# Patient Record
Sex: Female | Born: 1995 | Race: Black or African American | Hispanic: No | Marital: Married | Smoking: Never smoker
Health system: Southern US, Community
[De-identification: ages and names within clinical notes are randomized; demographics above are authoritative.]

## PROBLEM LIST (undated history)

## (undated) DIAGNOSIS — Z789 Other specified health status: Secondary | ICD-10-CM

## (undated) HISTORY — DX: Other specified health status: Z78.9

## (undated) HISTORY — PX: NO PAST SURGERIES: SHX2092

---

## 2020-06-10 ENCOUNTER — Encounter (HOSPITAL_COMMUNITY): Payer: Self-pay | Admitting: Emergency Medicine

## 2020-06-10 ENCOUNTER — Emergency Department (HOSPITAL_COMMUNITY)
Admission: EM | Admit: 2020-06-10 | Discharge: 2020-06-10 | Disposition: A | Discharge status: Home / Self Care | Attending: Emergency Medicine | Admitting: Emergency Medicine

## 2020-06-10 ENCOUNTER — Other Ambulatory Visit: Payer: Self-pay

## 2020-06-10 DIAGNOSIS — N898 Other specified noninflammatory disorders of vagina: Secondary | ICD-10-CM | POA: Diagnosis not present

## 2020-06-10 DIAGNOSIS — B3731 Acute candidiasis of vulva and vagina: Secondary | ICD-10-CM

## 2020-06-10 DIAGNOSIS — A64 Unspecified sexually transmitted disease: Secondary | ICD-10-CM | POA: Insufficient documentation

## 2020-06-10 DIAGNOSIS — B373 Candidiasis of vulva and vagina: Secondary | ICD-10-CM

## 2020-06-10 LAB — URINALYSIS, ROUTINE W REFLEX MICROSCOPIC
Bacteria, UA: NONE SEEN
Bilirubin Urine: NEGATIVE
Glucose, UA: NEGATIVE mg/dL
Hgb urine dipstick: NEGATIVE
Ketones, ur: NEGATIVE mg/dL
Nitrite: NEGATIVE
Protein, ur: NEGATIVE mg/dL
Specific Gravity, Urine: 1.014 (ref 1.005–1.030)
pH: 7 (ref 5.0–8.0)

## 2020-06-10 LAB — PREGNANCY, URINE: Preg Test, Ur: NEGATIVE

## 2020-06-10 LAB — WET PREP, GENITAL
Clue Cells Wet Prep HPF POC: NONE SEEN
Sperm: NONE SEEN
Trich, Wet Prep: NONE SEEN
Yeast Wet Prep HPF POC: NONE SEEN

## 2020-06-10 MED ORDER — FLUCONAZOLE 150 MG PO TABS
150.0000 mg | ORAL_TABLET | Freq: Once | ORAL | Status: AC
  Administered 2020-06-10: 150 mg via ORAL
  Filled 2020-06-10: qty 1

## 2020-06-10 MED ORDER — KETOCONAZOLE 2 % EX CREA: TOPICAL_CREAM | CUTANEOUS | 0 refills | Status: AC

## 2020-06-10 NOTE — ED Notes (Signed)
[name] speaking

## 2020-06-10 NOTE — ED Triage Notes (Signed)
Pt arrives to ED with concerns for STD. Husband has same symptoms. Pt is experiencing bumps and white discharge.

## 2020-06-10 NOTE — Discharge Instructions (Addendum)
Contact a health care provider if:  You have a fever.  Your symptoms go away and then return.  Your symptoms do not get better with treatment.  Your symptoms get worse.  You have new symptoms.  You develop blisters in or around your vagina.  You have blood coming from your vagina and it is not your menstrual period.  You develop pain in your abdomen.

## 2020-06-10 NOTE — ED Provider Notes (Signed)
[facility] EMERGENCY DEPARTMENT Provider Note   CSN: [id number] Arrival date & time: 06/10/20  1338     History Chief Complaint  Patient presents with  . SEXUALLY TRANSMITTED DISEASE    Linda Caldwell is a 24 y.o. female who is here with complaint of vaginal symptoms.  There is a language barrier and [place] services are utilized.  Despite using translation services still very difficult history as the patient is very vague in discussing her symptoms.  She states that she has recurrent infections.  She states that her vagina just "does not feel right."  She denies vaginal discharge but states that she has seen some white stuff.  She denies pain in her pelvis but states that it does burn when she urinates.  Last menstrual period was on 05/28/2020.  She is sexually active with a single female partner, her husband. she denies having any bumps or lesions in her groin.  She denies fevers, chills, hematuria, foul odor of urine or flank pain.  She is not on any form of birth control and is trying to get pregnant. HPI     History reviewed. No pertinent past medical history.  There are no problems to display for this patient.   .   OB History   No obstetric history on file.     History reviewed. No pertinent family history.     Home Medications Prior to Admission medications   Not on File    Allergies    Patient has no known allergies.  Review of Systems   Review of Systems  Constitutional: Negative for chills and fever.  Genitourinary: Positive for dysuria and vaginal discharge. Negative for flank pain, frequency, genital sores, hematuria, menstrual problem and pelvic pain.    Physical Exam Updated Vital Signs BP 120/79   Pulse 70   Temp 98.4 F (36.9 C) (Oral)   Resp 16   SpO2 100%   Physical Exam Vitals and nursing note reviewed. Exam conducted with a chaperone present.  Constitutional:      General: She is not in  acute distress.    Appearance: She is well-developed and well-nourished. She is not diaphoretic.  HENT:     Head: Normocephalic and atraumatic.  Eyes:     General: No scleral icterus.    Conjunctiva/sclera: Conjunctivae normal.  Cardiovascular:     Rate and Rhythm: Normal rate and regular rhythm.     Heart sounds: Normal heart sounds. No murmur heard. No friction rub. No gallop.   Pulmonary:     Effort: Pulmonary effort is normal. No respiratory distress.     Breath sounds: Normal breath sounds.  Abdominal:     General: Bowel sounds are normal. There is no distension.     Palpations: Abdomen is soft. There is no mass.     Tenderness: There is no abdominal tenderness. There is no guarding.  Genitourinary:    General: Normal vulva.     Pubic Area: No rash.      Labia:        Right: No rash.        Left: No rash.      Vagina: Vaginal discharge present.     Cervix: Normal.     Uterus: Normal.      Adnexa: Right adnexa normal and left adnexa normal.       Right: No tenderness.         Left: No tenderness.    Musculoskeletal:  Cervical back: Normal range of motion.  Lymphadenopathy:     Lower Body: No right inguinal adenopathy. No left inguinal adenopathy.  Skin:    General: Skin is warm and dry.  Neurological:     Mental Status: She is alert and oriented to person, place, and time.  Psychiatric:        Behavior: Behavior normal.     ED Results / Procedures / Treatments   Labs (all labs ordered are listed, but only abnormal results are displayed) Labs Reviewed  WET PREP, GENITAL  URINALYSIS, ROUTINE W REFLEX MICROSCOPIC  PREGNANCY, URINE  GC/CHLAMYDIA PROBE AMP ([place]) NOT AT [facility]    EKG None  Radiology No results found.  Procedures Procedures (including critical care time)  Medications Ordered in ED Medications - No data to display  ED Course  I have reviewed the triage vital signs and the nursing notes.  Pertinent labs & imaging results that  were available during my care of the patient were reviewed by me and considered in my medical decision making (see chart for details).    MDM Rules/Calculators/A&P                          Here with vaginal complaints.  She does have thick discharge consistent with what appears to be probably yeast vaginitis.  I ordered and reviewed labs which included wet prep which shows no significant abnormalities except for few white blood cells, negative pregnancy test, urine appears to be without infection, GC chlamydia probe is pending.  No evidence of PID on examination.  Will treat with single dose of oral fluconazole and then ketoconazole topically for 14 days.  Patient can follow-up with primary care physician.  She was appropriate for discharge at time Final Clinical Impression(s) / ED Diagnoses Final diagnoses:  None    Rx / DC Orders ED Discharge Orders    None       Arthor Captain, PA-C 06/10/20 1717    Tilden Fossa, MD 06/11/20 [phone]

## 2020-06-13 LAB — GC/CHLAMYDIA PROBE AMP (~~LOC~~) NOT AT ARMC
Chlamydia: NEGATIVE
Comment: NEGATIVE
Comment: NORMAL
Neisseria Gonorrhea: NEGATIVE

## 2020-06-18 NOTE — L&D Delivery Note (Signed)
Delivery Note Patient was complete a pushing when I entered. She quickly progressed to crowing and utilized asynchronous pushing and the mirror to push. Interpreter utilized the entire time.   At 6:07 PM a viable female was delivered via Vaginal, Spontaneous (Presentation: Right Occiput  Anterior).  APGAR: 9, 9; weight - pending.   Placenta status: Spontaneous, Intact.  Cord: 3 vessels with the following complications: None.  Cord pH: not collected  Anesthesia: Epidural Episiotomy: None Lacerations:  bilateral labial abrasions/hemostatic Suture Repair:  NA Est. Blood Loss (mL): 150  Mom to postpartum.  Baby to Couplet care / Skin to Skin.  Linda Caldwell [facility] 06/17/2021, 7:04 PM

## 2020-10-13 ENCOUNTER — Emergency Department (HOSPITAL_COMMUNITY)
Admission: EM | Admit: 2020-10-13 | Discharge: 2020-10-14 | Disposition: A | Discharge status: Home / Self Care | Attending: Emergency Medicine | Admitting: Emergency Medicine

## 2020-10-13 DIAGNOSIS — R630 Anorexia: Secondary | ICD-10-CM | POA: Diagnosis not present

## 2020-10-13 DIAGNOSIS — Z3A00 Weeks of gestation of pregnancy not specified: Secondary | ICD-10-CM | POA: Diagnosis not present

## 2020-10-13 DIAGNOSIS — Z3490 Encounter for supervision of normal pregnancy, unspecified, unspecified trimester: Secondary | ICD-10-CM

## 2020-10-13 DIAGNOSIS — R1033 Periumbilical pain: Secondary | ICD-10-CM | POA: Diagnosis not present

## 2020-10-13 DIAGNOSIS — R109 Unspecified abdominal pain: Secondary | ICD-10-CM

## 2020-10-13 DIAGNOSIS — R102 Pelvic and perineal pain: Secondary | ICD-10-CM | POA: Diagnosis not present

## 2020-10-13 DIAGNOSIS — O26891 Other specified pregnancy related conditions, first trimester: Secondary | ICD-10-CM | POA: Insufficient documentation

## 2020-10-13 DIAGNOSIS — R197 Diarrhea, unspecified: Secondary | ICD-10-CM | POA: Insufficient documentation

## 2020-10-13 LAB — COMPREHENSIVE METABOLIC PANEL
ALT: 15 U/L (ref 0–44)
AST: 17 U/L (ref 15–41)
Albumin: 3.7 g/dL (ref 3.5–5.0)
Alkaline Phosphatase: 61 U/L (ref 38–126)
Anion gap: 6 (ref 5–15)
BUN: 10 mg/dL (ref 6–20)
CO2: 24 mmol/L (ref 22–32)
Calcium: 9.3 mg/dL (ref 8.9–10.3)
Chloride: 105 mmol/L (ref 98–111)
Creatinine, Ser: 0.65 mg/dL (ref 0.44–1.00)
GFR, Estimated: >60 mL/min (ref >60)
Glucose, Bld: 84 mg/dL (ref 70–99)
Potassium: 3.4 mmol/L — ABNORMAL LOW (ref 3.5–5.1)
Sodium: 135 mmol/L (ref 135–145)
Total Bilirubin: 0.5 mg/dL (ref 0.3–1.2)
Total Protein: 7.4 g/dL (ref 6.5–8.1)

## 2020-10-13 LAB — CBC WITH DIFFERENTIAL/PLATELET
Abs Immature Granulocytes: 0.02 10*3/uL (ref 0.00–0.07)
Basophils Absolute: 0 10*3/uL (ref 0.0–0.1)
Basophils Relative: 1 %
Eosinophils Absolute: 0.1 10*3/uL (ref 0.0–0.5)
Eosinophils Relative: 2 %
HCT: 34.9 % — ABNORMAL LOW (ref 36.0–46.0)
Hemoglobin: 11.5 g/dL — ABNORMAL LOW (ref 12.0–15.0)
Immature Granulocytes: 0 %
Lymphocytes Relative: 44 %
Lymphs Abs: 2.6 10*3/uL (ref 0.7–4.0)
MCH: 31 pg (ref 26.0–34.0)
MCHC: 33 g/dL (ref 30.0–36.0)
MCV: 94.1 fL (ref 80.0–100.0)
Monocytes Absolute: 0.3 10*3/uL (ref 0.1–1.0)
Monocytes Relative: 5 %
Neutro Abs: 2.7 10*3/uL (ref 1.7–7.7)
Neutrophils Relative %: 48 %
Platelets: 316 10*3/uL (ref 150–400)
RBC: 3.71 MIL/uL — ABNORMAL LOW (ref 3.87–5.11)
RDW: 12.7 % (ref 11.5–15.5)
WBC: 5.8 10*3/uL (ref 4.0–10.5)
nRBC: 0 % (ref 0.0–0.2)

## 2020-10-13 LAB — URINALYSIS, ROUTINE W REFLEX MICROSCOPIC
Bilirubin Urine: NEGATIVE
Glucose, UA: NEGATIVE mg/dL
Hgb urine dipstick: NEGATIVE
Ketones, ur: NEGATIVE mg/dL
Nitrite: NEGATIVE
Protein, ur: NEGATIVE mg/dL
Specific Gravity, Urine: 1.021 (ref 1.005–1.030)
pH: 6 (ref 5.0–8.0)

## 2020-10-13 LAB — LIPASE, BLOOD: Lipase: 28 U/L (ref 11–51)

## 2020-10-13 MED ORDER — SODIUM CHLORIDE 0.9 % IV BOLUS
1000.0000 mL | Freq: Once | INTRAVENOUS | Status: AC
  Administered 2020-10-14: 1000 mL via INTRAVENOUS

## 2020-10-13 MED ORDER — KETOROLAC TROMETHAMINE 15 MG/ML IJ SOLN
15.0000 mg | Freq: Once | INTRAMUSCULAR | Status: AC
  Administered 2020-10-14: 15 mg via INTRAVENOUS
  Filled 2020-10-13: qty 1

## 2020-10-13 MED ORDER — ONDANSETRON HCL 4 MG/2ML IJ SOLN
4.0000 mg | Freq: Once | INTRAMUSCULAR | Status: AC
  Administered 2020-10-14: 4 mg via INTRAVENOUS
  Filled 2020-10-13: qty 2

## 2020-10-13 NOTE — ED Provider Notes (Addendum)
Emergency Medicine Provider Triage Evaluation Note  Linda Caldwell, a 25 y.o. female evaluated in triage.  Pt complains of abdominal pain, below belly and sometimes up higher in abdomen. Started 2 days ago. Pain is intermittent, better after a shower. Yesterday after meal made it worse. +diarrhea. No fever.   [name] translator used  BP 124/69 (BP Location: Left Arm)   Pulse 86   Temp 98.6 F (37 C) (Oral)   Resp 16   SpO2 100%   Patient is alert, no acute distress, normal work of breathing, abdomen is nontender.    Medically screening exam initiated at 8:59 PM. Appropriate orders placed.  Meshawn Cirrincione was informed that the remainder of the evaluation will be completed by another provider, this initial triage assessment does not replace that evaluation, and the importance of remaining in the ED until their evaluation is complete.       Andretta Ergle, [place] N, PA-C 10/13/20 2103    Laqueisha Catalina, [place] N, PA-C 10/13/20 2104    Gwyneth Sprout, MD 10/17/20 [phone]

## 2020-10-13 NOTE — ED Triage Notes (Signed)
Pt reports intermittent abd pain & diarrhea  started last night. Pt denies any N/V

## 2020-10-14 ENCOUNTER — Emergency Department (HOSPITAL_COMMUNITY)

## 2020-10-14 ENCOUNTER — Other Ambulatory Visit: Payer: Self-pay

## 2020-10-14 LAB — ABO/RH: ABO/RH(D): A POS

## 2020-10-14 LAB — POC URINE PREG, ED: Preg Test, Ur: POSITIVE — AB

## 2020-10-14 LAB — HCG, QUANTITATIVE, PREGNANCY: hCG, Beta Chain, Quant, S: 32 m[IU]/mL — ABNORMAL HIGH (ref <5)

## 2020-10-14 MED ORDER — PRENATAL COMPLETE 14-0.4 MG PO TABS: 1.0000 | ORAL_TABLET | Freq: Every day | ORAL | 1 refills | Status: DC

## 2020-10-14 NOTE — ED Provider Notes (Signed)
[facility]EMER[facility]Y [facility] Emergency Department Provider Note MRN:  [id number]  Arrival date & time: 10/14/20     Chief Complaint   Abdominal pain History of Present Illness   Linda Caldwell is a 25 y.o. year-old female with no pertinent past medical history presenting to the ED with chief complaint of abdominal pain.  Location: Suprapubic region, radiating to periumbilical region Duration: 2 day Onset: Gradual Timing: Constant Description: Dull Severity: Moderate to severe Exacerbating/Alleviating Factors: Worse when eating Associated Symptoms: Diarrhea, decreased appetite today Pertinent Negatives: Denies fever, no chest pain or shortness of breath, no vaginal bleeding or discharge   Review of Systems  A complete 10 system review of systems was obtained and all systems are negative except as noted in the HPI and PMH.   Patient's Health History   Past medical history: No prior abdominal surgery  Social history: Does not smoke   No family history on file.  Social History   Socioeconomic History  . Marital status: Married    Spouse name: Not on file  . Number of children: Not on file  . Years of education: Not on file  . Highest education level: Not on file  Occupational History  . Not on file  Tobacco Use  . Smoking status: Not on file  . Smokeless tobacco: Not on file  Substance and Sexual Activity  . Alcohol use: Not on file  . Drug use: Not on file  . Sexual activity: Not on file  Other Topics Concern  . Not on file  Social History Narrative  . Not on file   Social Determinants of Health   Financial Resource Strain: Not on file  Food Insecurity: Not on file  Transportation Needs: Not on file  Physical Activity: Not on file  Stress: Not on file  Social Connections: Not on file  Intimate Partner Violence: Not on file     Physical Exam   Vitals:   10/14/20 0004 10/14/20 0301  BP: 114/82 (!) 116/59  Pulse: 79 79  Resp: 16 19  Temp:     SpO2: 100% 100%    CONSTITUTIONAL: Well-appearing, NAD NEURO:  Alert and oriented x 3, no focal deficits EYES:  eyes equal and reactive ENT/NECK:  no LAD, no JVD CARDIO: Regular rate, well-perfused, normal S1 and S2 PULM:  CTAB no wheezing or rhonchi GI/GU:  normal bowel sounds, non-distended, moderate suprapubic abdominal tenderness MSK/SPINE:  No gross deformities, no edema SKIN:  no rash, atraumatic PSYCH:  Appropriate speech and behavior  *Additional and/or pertinent findings included in MDM below  Diagnostic and Interventional Summary    EKG Interpretation  Date/Time:    Ventricular Rate:    PR Interval:    QRS Duration:   QT Interval:    QTC Calculation:   R Axis:     Text Interpretation:        Labs Reviewed  COMPREHENSIVE METABOLIC PANEL - Abnormal; Notable for the following components:      Result Value   Potassium 3.4 (*)    All other components within normal limits  CBC WITH DIFFERENTIAL/PLATELET - Abnormal; Notable for the following components:   RBC 3.71 (*)    Hemoglobin 11.5 (*)    HCT 34.9 (*)    All other components within normal limits  URINALYSIS, ROUTINE W REFLEX MICROSCOPIC - Abnormal; Notable for the following components:   APPearance HAZY (*)    Leukocytes,Ua LARGE (*)    Bacteria, UA RARE (*)    All other  components within normal limits  HCG, QUANTITATIVE, PREGNANCY - Abnormal; Notable for the following components:   hCG, Beta Chain, Quant, S 32 (*)    All other components within normal limits  POC URINE PREG, ED - Abnormal; Notable for the following components:   Preg Test, Ur POSITIVE (*)    All other components within normal limits  LIPASE, BLOOD  POC URINE PREG, ED  ABO/RH    US OB Comp < 14 Wks  Final Result    US OB Transvaginal  Final Result    US APPENDIX (ABDOMEN LIMITED)  Final Result      Medications  sodium chloride 0.9 % bolus 1,000 mL (0 mLs Intravenous Stopped 10/14/20 0127)  ketorolac (TORADOL) 15 MG/ML  injection 15 mg (15 mg Intravenous Given 10/14/20 0020)  ondansetron (ZOFRAN) injection 4 mg (4 mg Intravenous Given 10/14/20 0020)     Procedures  /  Critical Care Procedures  ED Course and Medical Decision Making  I have reviewed the triage vital signs, the nursing notes, and pertinent available records from the EMR.  Listed above are laboratory and imaging tests that I personally ordered, reviewed, and interpreted and then considered in my medical decision making (see below for details).  Favoring colitis given the diarrhea but also considering appendicitis, will obtain CT imaging.     Zofran and Toradol were ordered for symptom management.  Delay in obtaining hCG status, I requested that nursing staff confirm patient is not pregnant prior to giving the Toradol.  I received verbal confirmation that they would do so.  Still somehow the Toradol was given despite the positive hCG.  We will proceed with ultrasound given the pregnancy status.  Ultrasound is overall without acute process.  Appendix appears normal.  Overall it is too early in the pregnancy to assess IUP versus ectopic.  There is some free fluid in the abdomen, favoring physiologic given her overall benign exam.  Will follow-up with GYN, strict return precautions.  Entire history, plan obtained and discussed using [name] audio interpreter and patient expresses full understanding.  Elmer Sow. Pilar Plate, MD [facility] Emergency Medicine [facility] [email or web address]  Final Clinical Impressions(s) / ED Diagnoses     ICD-10-CM   1. Early stage of pregnancy  Z34.90   2. Abdominal pain  R10.9 US APPENDIX (ABDOMEN LIMITED)    US APPENDIX (ABDOMEN LIMITED)  3. Pelvic pain  R10.2 US OB Transvaginal    US OB Transvaginal    ED Discharge Orders         Ordered    Prenatal Vit-Fe Fumarate-FA (PRENATAL COMPLETE) 14-0.4 MG TABS  Daily        10/14/20 0337           Discharge Instructions Discussed with and  Provided to Patient:     Discharge Instructions     You were evaluated in the Emergency Department and after careful evaluation, we did not find any emergent condition requiring admission or further testing in the hospital.  Your exam/testing today was overall reassuring.  Your testing today showed a positive pregnancy test.  As we discussed, your ultrasound did not show any emergencies, but your pregnancy is very early and we need to repeat the ultrasound in 2 weeks.  Please call the number provided to follow-up with an OB/GYN doctor.  If you cannot see an OB/GYN doctor within 2 weeks, please return to the emergency department for repeat ultrasound.  Use Tylenol for pain, avoid alcohol, avoid Motrin.  Recommend taking the prenatal vitamin prescribed.  Please return to the Emergency Department if you experience any worsening of your condition such as worsening pain, fever, or vaginal bleeding.  Thank you for allowing [place] to be a part of your care.        Sabas Sous, MD 10/14/20 [phone]

## 2020-10-14 NOTE — Discharge Instructions (Addendum)
You were evaluated in the Emergency Department and after careful evaluation, we did not find any emergent condition requiring admission or further testing in the hospital.  Your exam/testing today was overall reassuring.  Your testing today showed a positive pregnancy test.  As we discussed, your ultrasound did not show any emergencies, but your pregnancy is very early and we need to repeat the ultrasound in 2 weeks.  Please call the number provided to follow-up with an OB/GYN doctor.  If you cannot see an OB/GYN doctor within 2 weeks, please return to the emergency department for repeat ultrasound.  Use Tylenol for pain, avoid alcohol, avoid Motrin.  Recommend taking the prenatal vitamin prescribed.  Please return to the Emergency Department if you experience any worsening of your condition such as worsening pain, fever, or vaginal bleeding.  Thank you for allowing [place] to be a part of your care.

## 2020-11-09 ENCOUNTER — Other Ambulatory Visit: Payer: Self-pay

## 2020-11-09 ENCOUNTER — Ambulatory Visit

## 2020-11-09 NOTE — Progress Notes (Signed)
Called pt to start NOB intake, husband advised that pt was on the way to the hospital.

## 2020-11-23 DIAGNOSIS — Z3490 Encounter for supervision of normal pregnancy, unspecified, unspecified trimester: Secondary | ICD-10-CM

## 2020-11-23 HISTORY — DX: Encounter for supervision of normal pregnancy, unspecified, unspecified trimester: Z34.90

## 2020-11-24 ENCOUNTER — Ambulatory Visit (INDEPENDENT_AMBULATORY_CARE_PROVIDER_SITE_OTHER): Admitting: Obstetrics

## 2020-11-24 ENCOUNTER — Other Ambulatory Visit (HOSPITAL_COMMUNITY)
Admission: RE | Admit: 2020-11-24 | Discharge: 2020-11-24 | Disposition: A | Discharge status: Home / Self Care | Source: Ambulatory Visit | Attending: Obstetrics | Admitting: Obstetrics

## 2020-11-24 ENCOUNTER — Encounter: Payer: Self-pay | Admitting: Obstetrics

## 2020-11-24 ENCOUNTER — Other Ambulatory Visit: Payer: Self-pay

## 2020-11-24 VITALS — BP 118/72 | HR 84 | Wt 148.0 lb

## 2020-11-24 DIAGNOSIS — O26899 Other specified pregnancy related conditions, unspecified trimester: Secondary | ICD-10-CM | POA: Diagnosis not present

## 2020-11-24 DIAGNOSIS — O99340 Other mental disorders complicating pregnancy, unspecified trimester: Secondary | ICD-10-CM | POA: Diagnosis not present

## 2020-11-24 DIAGNOSIS — Z3490 Encounter for supervision of normal pregnancy, unspecified, unspecified trimester: Secondary | ICD-10-CM | POA: Insufficient documentation

## 2020-11-24 DIAGNOSIS — O219 Vomiting of pregnancy, unspecified: Secondary | ICD-10-CM

## 2020-11-24 DIAGNOSIS — R12 Heartburn: Secondary | ICD-10-CM

## 2020-11-24 DIAGNOSIS — F32A Depression, unspecified: Secondary | ICD-10-CM

## 2020-11-24 DIAGNOSIS — B373 Candidiasis of vulva and vagina: Secondary | ICD-10-CM | POA: Diagnosis not present

## 2020-11-24 DIAGNOSIS — Z603 Acculturation difficulty: Secondary | ICD-10-CM

## 2020-11-24 DIAGNOSIS — B3731 Acute candidiasis of vulva and vagina: Secondary | ICD-10-CM

## 2020-11-24 MED ORDER — FAMOTIDINE 20 MG PO TABS: 20.0000 mg | ORAL_TABLET | Freq: Two times a day (BID) | ORAL | 5 refills | Status: DC

## 2020-11-24 MED ORDER — VITAFOL ULTRA 29-0.6-0.4-200 MG PO CAPS: 1.0000 | ORAL_CAPSULE | Freq: Every day | ORAL | 4 refills | Status: DC

## 2020-11-24 MED ORDER — TERCONAZOLE 0.4 % VA CREA: 1.0000 | TOPICAL_CREAM | Freq: Every day | VAGINAL | 0 refills | Status: DC

## 2020-11-24 MED ORDER — VITAFOL GUMMIES 3.33-0.333-34.8 MG PO CHEW
3.0000 | CHEWABLE_TABLET | Freq: Every day | ORAL | 11 refills | Status: DC
Start: 2020-11-24 — End: 2022-09-19

## 2020-11-24 NOTE — Progress Notes (Signed)
Subjective:    Linda Caldwell is being seen today for her first obstetrical visit.  This is a planned pregnancy. She is at [redacted]w[redacted]d gestation. Her obstetrical history is significant for  none . Relationship with FOB: significant other, living together. Patient does intend to breast feed. Pregnancy history fully reviewed.  The information documented in the HPI was reviewed and verified.  Menstrual History: OB History     Gravida  2   Para      Term      Preterm      AB  1   Living  0      SAB  1   IAB      Ectopic      Multiple      Live Births               Patient's last menstrual period was 09/17/2020.    Past Medical History:  Diagnosis Date   Medical history non-contributory      No Known Allergies  Social History   Tobacco Use   Smoking status: Never   Smokeless tobacco: Never  Substance Use Topics   Alcohol use: Not Currently    Family History  Problem Relation Age of Onset   Hyperlipidemia Maternal Aunt      Review of Systems Constitutional: negative for weight loss Gastrointestinal: negative for vomiting Genitourinary:negative for genital lesions and vaginal discharge and dysuria Musculoskeletal:negative for back pain Behavioral/Psych: negative for abusive relationship, depression, illegal drug usage and tobacco use    Objective:    BP 118/72   Pulse 84   Wt 148 lb (67.1 kg)   LMP 09/17/2020   BMI 26.22 kg/m  General Appearance:    Alert, cooperative, no distress, appears stated age  Head:    Normocephalic, without obvious abnormality, atraumatic  Eyes:    PERRL, conjunctiva/corneas clear, EOM's intact, fundi    benign, both eyes  Ears:    Normal TM's and external ear canals, both ears  Nose:   Nares normal, septum midline, mucosa normal, no drainage    or sinus tenderness  Throat:   Lips, mucosa, and tongue normal; teeth and gums normal  Neck:   Supple, symmetrical, trachea midline, no adenopathy;    thyroid:  no  enlargement/tenderness/nodules; no carotid   bruit or JVD  Back:     Symmetric, no curvature, ROM normal, no CVA tenderness  Lungs:     Clear to auscultation bilaterally, respirations unlabored  Chest Wall:    No tenderness or deformity   Heart:    Regular rate and rhythm, S1 and S2 normal, no murmur, rub   or gallop  Breast Exam:    No tenderness, masses, or nipple abnormality  Abdomen:     Soft, non-tender, bowel sounds active all four quadrants,    no masses, no organomegaly  Genitalia:    Normal female without lesion, discharge or tenderness  Extremities:   Extremities normal, atraumatic, no cyanosis or edema  Pulses:   2+ and symmetric all extremities  Skin:   Skin color, texture, turgor normal, no rashes or lesions  Lymph nodes:   Cervical, supraclavicular, and axillary nodes normal  Neurologic:   CNII-XII intact, normal strength, sensation and reflexes    throughout      Lab Review Urine pregnancy test Labs reviewed yes Radiologic studies reviewed no   Assessment:    Pregnancy at [redacted]w[redacted]d weeks    Plan:    1. Encounter for supervision of normal pregnancy,  antepartum, unspecified gravidity Rx: - Cytology - PAP( [place]) - Culture, OB Urine - Obstetric Panel, Including HIV - Hepatitis C Antibody - Cervicovaginal ancillary only( ) - [place] MFM OB COMP + 14 WK; Future  2. Candida vaginitis Rx: - terconazole (TERAZOL 7) 0.4 % vaginal cream; Place 1 applicator vaginally at bedtime.  Dispense: 45 g; Refill: 0  3. Depression affecting pregnancy Rx: - Ambulatory referral to Integrated Behavioral Health  4. Heartburn during pregnancy, antepartum Rx: - famotidine (PEPCID) 20 MG tablet; Take 1 tablet (20 mg total) by mouth 2 (two) times daily.  Dispense: 60 tablet; Refill: 5  5. Special needs due to language barrier - interpreter present, and will be required for each prenatal visit    6. Nausea and vomiting during pregnancy Rx: - Prenatal Vit-Fe  Phos-FA-Omega (VITAFOL GUMMIES) 3.33-0.333-34.8 MG CHEW; Chew 3 tablets by mouth daily before breakfast.  Dispense: 90 tablet; Refill: 11    Prenatal vitamins.  Counseling provided regarding continued use of seat belts, cessation of alcohol consumption, smoking or use of illicit drugs; infection precautions i.e., influenza/TDAP immunizations, toxoplasmosis,CMV, parvovirus, listeria and varicella; workplace safety, exercise during pregnancy; routine dental care, safe medications, sexual activity, hot tubs, saunas, pools, travel, caffeine use, fish and methlymercury, potential toxins, hair treatments, varicose veins Weight gain recommendations per IOM guidelines reviewed: underweight/BMI< 18.5--> gain 28 - 40 lbs; normal weight/BMI 18.5 - 24.9--> gain 25 - 35 lbs; overweight/BMI 25 - 29.9--> gain 15 - 25 lbs; obese/BMI >30->gain  11 - 20 lbs Problem list reviewed and updated. FIRST/CF mutation testing/NIPT/QUAD SCREEN/fragile X/Ashkenazi Jewish population testing/Spinal muscular atrophy discussed: requested. Role of ultrasound in pregnancy discussed; fetal survey: requested. Amniocentesis discussed: not indicated.  Meds ordered this encounter  Medications   famotidine (PEPCID) 20 MG tablet    Sig: Take 1 tablet (20 mg total) by mouth 2 (two) times daily.    Dispense:  60 tablet    Refill:  5   terconazole (TERAZOL 7) 0.4 % vaginal cream    Sig: Place 1 applicator vaginally at bedtime.    Dispense:  45 g    Refill:  0   Orders Placed This Encounter  Procedures   Culture, OB Urine   [place] MFM OB COMP + 14 WK    Standing Status:   Future    Standing Expiration Date:   11/23/2021    Order Specific Question:   Reason for Exam (SYMPTOM  OR DIAGNOSIS REQUIRED)    Answer:   Anatomy    Order Specific Question:   Preferred Location    Answer:   [facility] Ultrasound   Obstetric Panel, Including HIV   Hepatitis C Antibody   Ambulatory referral to Integrated Behavioral Health    Referral Priority:    Routine    Referral Type:   Consultation    Referral Reason:   Specialty Services Required    Number of Visits Requested:   1    Follow up in 2 weeks.  Brock Bad, MD 11/24/2020 2:44 PM

## 2020-11-24 NOTE — Progress Notes (Signed)
NOB, c/o yellowish discharge, odor, abdominal pain x 1 month, heartburn.

## 2020-11-25 LAB — CYTOLOGY - PAP
Adequacy: ABSENT
Comment: NEGATIVE
Diagnosis: NEGATIVE
High risk HPV: NEGATIVE

## 2020-11-25 LAB — OBSTETRIC PANEL, INCLUDING HIV
Antibody Screen: NEGATIVE
Basophils Absolute: 0 10*3/uL (ref 0.0–0.2)
Basos: 1 %
EOS (ABSOLUTE): 0.1 10*3/uL (ref 0.0–0.4)
Eos: 2 %
HIV Screen 4th Generation wRfx: NONREACTIVE
Hematocrit: 37.2 % (ref 34.0–46.6)
Hemoglobin: 12.4 g/dL (ref 11.1–15.9)
Hepatitis B Surface Ag: NEGATIVE
Immature Grans (Abs): 0 10*3/uL (ref 0.0–0.1)
Immature Granulocytes: 0 %
Lymphocytes Absolute: 1.7 10*3/uL (ref 0.7–3.1)
Lymphs: 32 %
MCH: 31.5 pg (ref 26.6–33.0)
MCHC: 33.3 g/dL (ref 31.5–35.7)
MCV: 94 fL (ref 79–97)
Monocytes Absolute: 0.4 10*3/uL (ref 0.1–0.9)
Monocytes: 7 %
Neutrophils Absolute: 3 10*3/uL (ref 1.4–7.0)
Neutrophils: 58 %
Platelets: 245 10*3/uL (ref 150–450)
RBC: 3.94 x10E6/uL (ref 3.77–5.28)
RDW: 12.9 % (ref 11.7–15.4)
RPR Ser Ql: NONREACTIVE
Rh Factor: POSITIVE
Rubella Antibodies, IGG: 23.8 index (ref >0.99)
WBC: 5.2 10*3/uL (ref 3.4–10.8)

## 2020-11-25 LAB — CERVICOVAGINAL ANCILLARY ONLY
Bacterial Vaginitis (gardnerella): POSITIVE — AB
Candida Glabrata: NEGATIVE
Candida Vaginitis: POSITIVE — AB
Chlamydia: NEGATIVE
Comment: NEGATIVE
Comment: NEGATIVE
Comment: NEGATIVE
Comment: NEGATIVE
Comment: NEGATIVE
Comment: NORMAL
Neisseria Gonorrhea: NEGATIVE
Trichomonas: NEGATIVE

## 2020-11-25 LAB — HEPATITIS C ANTIBODY: Hep C Virus Ab: <0.1 s/co ratio (ref 0.0–0.9)

## 2020-11-27 ENCOUNTER — Other Ambulatory Visit: Payer: Self-pay | Admitting: Obstetrics

## 2020-11-27 DIAGNOSIS — N760 Acute vaginitis: Secondary | ICD-10-CM

## 2020-11-27 DIAGNOSIS — N3000 Acute cystitis without hematuria: Secondary | ICD-10-CM

## 2020-11-27 LAB — URINE CULTURE, OB REFLEX

## 2020-11-27 LAB — CULTURE, OB URINE

## 2020-11-27 MED ORDER — NITROFURANTOIN MONOHYD MACRO 100 MG PO CAPS: 100.0000 mg | ORAL_CAPSULE | Freq: Two times a day (BID) | ORAL | 2 refills | Status: DC

## 2020-11-27 MED ORDER — METRONIDAZOLE 500 MG PO TABS: 500.0000 mg | ORAL_TABLET | Freq: Two times a day (BID) | ORAL | 2 refills | Status: DC

## 2020-11-28 ENCOUNTER — Telehealth: Payer: Self-pay

## 2020-11-28 NOTE — Telephone Encounter (Signed)
-----   Message from Brock Bad, MD sent at 11/27/2020  8:15 AM EDT ----- Macrobid Rx for UTI Flagyl Rx for BV Terazol 7 Rx for yeast

## 2020-11-28 NOTE — Telephone Encounter (Signed)
Called pat, no answer patient reviewed test results online via my-chart.

## 2020-12-06 ENCOUNTER — Telehealth: Payer: Self-pay

## 2020-12-06 ENCOUNTER — Ambulatory Visit (INDEPENDENT_AMBULATORY_CARE_PROVIDER_SITE_OTHER): Payer: Self-pay | Admitting: Licensed Clinical Social Worker

## 2020-12-06 DIAGNOSIS — O99340 Other mental disorders complicating pregnancy, unspecified trimester: Secondary | ICD-10-CM

## 2020-12-06 DIAGNOSIS — F32A Depression, unspecified: Secondary | ICD-10-CM

## 2020-12-06 NOTE — Telephone Encounter (Signed)
Mailed patient her GFE with a new patient letter and map.  

## 2020-12-07 NOTE — BH Specialist Note (Signed)
Integrated Behavioral Health via Telemedicine Visit  12/07/2020 Linda Caldwell [id number]  Number of Integrated Behavioral Health visits: 1 Session Start time: 2:00pm  Session End time: 2:25pm Total time: 25 mins via mychart with french interpretor [phone] Linda Caldwell  Referring Provider: Aron Baba MD  Patient/Family location: Home  [facility] Provider location: [facility]  All persons participating in visit: Pt Linda Caldwell, Linda Caldwell and LCSWA A. Felton Clinton  Types of Service: General Behavioral Integrated Care (BHI)  I connected with Linda Caldwell and/or Linda Caldwell's n/avia  Telephone or Video Enabled Telemedicine Application  (Video is Caregility application) and verified that I am speaking with the correct person using two identifiers. Discussed confidentiality: Yes   I discussed the limitations of telemedicine and the availability of in person appointments.  Discussed there is a possibility of technology failure and discussed alternative modes of communication if that failure occurs.  I discussed that engaging in this telemedicine visit, they consent to the provision of behavioral healthcare and the services will be billed under their insurance.  Patient and/or legal guardian expressed understanding and consented to Telemedicine visit: Yes   Presenting Concerns: Patient and/or family reports the following symptoms/concerns Depression  Duration of problem: Pt reports unsure ; Severity of problem: mild  Patient and/or Family's Strengths/Protective Factors: Concrete supports in place (healthy food, safe environments, etc.)  Goals Addressed: Patient will:  Reduce symptoms of: depression   Increase knowledge and/or ability of: healthy habits   Demonstrate ability to: Increase adequate support systems for patient/family  Progress towards Goals: Ongoing  Interventions: Interventions utilized:  Motivational Interviewing Standardized Assessments completed:  n/a  Patient and/or Family Response: Pt reports spouse is supportive   Assessment: Patient currently experiencing depression affecting pregnancy. Pt reports loss of appetite. Pt advise to eat small meal throughout the day   Patient may benefit from integrated behavioral health.  Plan: Follow up with behavioral health clinician on : as needed  Behavioral recommendations: Eat small meals, take prenatal vitamins, communicate needs with support system  Referral(s): Integrated [employer] (In Clinic)  I discussed the assessment and treatment plan with the patient and/or parent/guardian. They were provided an opportunity to ask questions and all were answered. They agreed with the plan and demonstrated an understanding of the instructions.   They were advised to call back or seek an in-person evaluation if the symptoms worsen or if the condition fails to improve as anticipated.  Gwyndolyn Saxon, LCSW

## 2020-12-08 ENCOUNTER — Ambulatory Visit (INDEPENDENT_AMBULATORY_CARE_PROVIDER_SITE_OTHER): Payer: Medicaid Other | Admitting: Obstetrics and Gynecology

## 2020-12-08 ENCOUNTER — Other Ambulatory Visit: Payer: Self-pay

## 2020-12-08 ENCOUNTER — Encounter: Payer: Self-pay | Admitting: Obstetrics and Gynecology

## 2020-12-08 DIAGNOSIS — Z603 Acculturation difficulty: Secondary | ICD-10-CM | POA: Insufficient documentation

## 2020-12-08 DIAGNOSIS — Z3490 Encounter for supervision of normal pregnancy, unspecified, unspecified trimester: Secondary | ICD-10-CM | POA: Diagnosis not present

## 2020-12-08 DIAGNOSIS — Z758 Other problems related to medical facilities and other health care: Secondary | ICD-10-CM

## 2020-12-08 DIAGNOSIS — Z789 Other specified health status: Secondary | ICD-10-CM

## 2020-12-08 HISTORY — DX: Acculturation difficulty: Z60.3

## 2020-12-08 HISTORY — DX: Other problems related to medical facilities and other health care: Z75.8

## 2020-12-08 NOTE — Progress Notes (Signed)
Subjective:  Linda Caldwell is a 25 y.o. G2P0010 at [redacted]w[redacted]d being seen today for ongoing prenatal care.  She is currently monitored for the following issues for this low-risk pregnancy and has Supervision of normal pregnancy, antepartum and Language barrier on their problem list.  Patient reports no complaints.  Contractions: Not present. Vag. Bleeding: None.   . Denies leaking of fluid.   The following portions of the patient's history were reviewed and updated as appropriate: allergies, current medications, past family history, past medical history, past social history, past surgical history and problem list. Problem list updated.  Objective:   Vitals:   12/08/20 1019  BP: 106/65  Pulse: 79  Weight: 126 lb (57.2 kg)    Fetal Status:           General:  Alert, oriented and cooperative. Patient is in no acute distress.  Skin: Skin is warm and dry. No rash noted.   Cardiovascular: Normal heart rate noted  Respiratory: Normal respiratory effort, no problems with respiration noted  Abdomen: Soft, gravid, appropriate for gestational age. Pain/Pressure: Present     Pelvic:  Cervical exam deferred        Extremities: Normal range of motion.  Edema: None  Mental Status: Normal mood and affect. Normal behavior. Normal judgment and thought content.   Urinalysis:      Assessment and Plan:  Pregnancy: G2P0010 at [redacted]w[redacted]d  1. Encounter for supervision of normal pregnancy, antepartum, unspecified gravidity Hand held U/S + FM and cardiac activity Stable  2. Language barrier Live interrupter used during today's visit  Preterm labor symptoms and general obstetric precautions including but not limited to vaginal bleeding, contractions, leaking of fluid and fetal movement were reviewed in detail with the patient. Please refer to After Visit Summary for other counseling recommendations.  Return in about 4 weeks (around 01/05/2021) for OB visit, face to face, any provider.   Hermina Staggers,  MD

## 2020-12-08 NOTE — Patient Instructions (Signed)
Obstetrics: Normal and Problem Pregnancies (7th ed., pp. 102-121). [place], [place]: Elsevier."> Textbook of Family Medicine (9th ed., pp. [phone]). [place], [place]: Elsevier Kohl's trimestre de la grossesse First Trimester of Pregnancy  Le premier trimestre de grossesse commence [employer] premier jour de vos dernires rgles et se termine  la fin de la 12e semaine. Il s'agit de la priode comprise Hershey [employer] 1er mois et le 3e mois de la [place]. Lorsqu'un spermatozode fconde un ovule, l'ovule fcond se fixe sur la paroi de l'utrus une semaine plus tard et commence  se dvelopper jusqu' devenir un bb. Au bout de 12 semaines environ, tous les PPG Industries bb seront forms etcelui-ci mesurera 5  7,5 cm (2  3 pouces). Changements corporels pendant le premier trimestre [employer] corps subit de nombreux changements pendant la grossesse. Les changementssont variables et disparaissent gnralement aprs la naissance de votre bb. Changements physiques Vous pourriez prendre ou perdre Kellogg. Vos seins pourront commencer  prendre du volume et  [employer]. Le tissu qui entoure vos mamelons (arole) pourrait devenir plus sombre. Des points ou des taches foncs (chloasma ou masque de [place]) pourraient apparatre sur votre visage. Vos cheveux pourraient [employer]. Ils pourraient notamment devenir plus pais ou plus fins, ou bien Water engineer. Changements dans votre tat de sant Vous pourriez vous sentir nauseuse, et vous pourriez vomir. Vous pourriez souffrir de Research officer, trade union. Vous pourriez commencer  souffrir de maux de tte. Vous pourriez commencer  souffrir de constipation. Vos gencives pourraient saigner et tre Marriott brossage et au fil dentaire. Autres changements Vous pourriez vous fatiguer plus facilement. Vous pourriez Geneticist, molecular. Vous cesserez d'avoir vos rgles. Vous pourriez perdre l'apptit. Vous  pourriez avoir des Cardinal Health certains types d'aliments. Vos motions Ship broker. Vous pourriez BlueLinx plus Nordstrom et tranges. Suivez les instructions suivantes  domicile : [employer] les instructions de votre prestataire de soins de sant en ce qui concerne l'utilisation des mdicaments. Certains mdicaments sont sans danger et peuvent tre utiliss pendant la grossesse tandis que d'autres peuvent se rvler dangereux. Ne prenez aucun mdicament en dehors de ceux prescrits par votre prestataire de soins de sant. Prenez des vitamines prnatales qui contiennent au moins 600 microgrammes d'acide folique. Alimentation et boissons Adoptez une alimentation saine compose de fruits et lgumes frais, de crales compltes, de bonnes sources de protines, comme de la viande, des oufs ou du tofu, et de produits laitiers faibles en graisses. vitez de consommer de la viande crue et du jus, du lait et du fromage non pasteuriss. Ces produits sont porteurs de germes susceptibles d'tre nocifs pour votre bb et vous mme. Si vous souffrez de nauses ou de vomissements : Mangez 4 ou 5 petits repas au cours de la journe au lieu de 3 repas copieux. Essayez de manger quelques biscuits sals. Au lieu de boire pendant que vous mangez, buvez entre les repas. Vous devrez peut-tre prendre les mesures suivantes pour prvenir ou traiter la constipation : Boire des Energy East Corporation de liquides pour garder votre urine jaune ple. Consommer des aliments riches en fibres, notamment des haricots secs, des crales compltes, des fruits et des lgumes frais. Limiter votre consommation d'aliments riches en graisses et en sucres transforms, par exemple les aliments frits ou sucrs. Activits Pratiquez une activit physique en suivant scrupuleusement les instructions de votre prestataire de soins de sant. La plupart des femmes peuvent continuer de pratiquer leurs  activits physiques habituelles  pendant leur grossesse. Tobi Bastos de pratiquer une activit physique au moins 30 minutes par jour, 5 jours par semaine. Arrtez de faire de l'exercice si vous commencez  ressentir Lyondell Chemical ou des Southwest Airlines la rgion infrieure de votre abdomen ou au bas du [place]. vitez de faire de l'exercice s'il fait trop chaud ou trop humide, ou si vous vous trouvez dans un lieu en altitude. vitez de Chesapeake Energy. Sauf indication contraire de IT trainer de soins de sant, vous pouvez continuer  avoir des rapports sexuels si Industrial/product designer. Soulager la douleur et l'inconfort Portez un soutien-gorge de maintien de bonne qualit pour soulager les symptmes de sensibilit des seins. Gardez vos jambes surleves si vous avez des Xcel Energy ou des Mattel bas Bristol[employer]. Si des OfficeMax Incorporated (varices) apparaissent sur vos jambes : Portez des bas de contention, conformment Psychologist, educational de votre prestataire de soins de sant. Surlevez vos pieds pendant 15 minutes, 3  4 fois par jour. Rduisez votre consommation de sel. Scurit Portez toujours votre ceinture de scurit lorsque vous conduisez ou lorsque vous tes  bord d'un vhicule. Si quelqu'un exerce sur vous des violences verbales ou physiques, parlez-en  votre prestataire de soins de sant. Si vous vous sentez triste ou si vous pensez  vous faire du mal, parlez-en  votre prestataire de soins de sant. Mode de vie N'utilisez pas les jacuzzis, les bains de vapeur ou les saunas. N'utilisez pas de douche vaginale. N'utilisez pas de tampons ou de serviettes hyginiques parfumes. Ne prenez pas de remde  base de plantes, ne consommez pas d'alcool ni de drogues, et ne prenez aucun mdicament en dehors de ceux approuvs par votre prestataire de soins de sant. Les produits chimiques contenus dans ces produits peuvent nuire  votre bb. N'utilisez pas de produits contenant du  tabac ou de la nicotine, tels que les cigarettes, les cigarettes lectroniques et Recruitment consultant. Si vous avez besoin d'aide pour arrter de fumer, demandez conseil  votre prestataire de soins de sant. vitez de Estée Lauder litires de chats et la terre de jardin utilises par KeyCorp. Celles-ci sont porteuses de germes qui peuvent entraner des malformations congnitales Merchant navy officer bb et le risque de perdre l'enfant  natre (fotus) par IAC/InterActiveCorp fausse couche ou la mise au monde d'un enfant mort-n. Instructions gnrales Au cours des visites prnatales de routine du premier trimestre, IT trainer de soins de sant effectuera un examen physique, ralisera les tests ncessaires et vous demandera comment vous allez. Rendez-vous  toutes vos visites de suivi. C'est important. Demandez de l'aide si vous avez besoin de conseils particuliers ou de conseils en matire de nutrition pendant votre grossesse. Votre prestataire de soins de sant pourra vous donner des conseils ou vous rediriger vers des spcialistes qui pourront rpondre  vos diffrents besoins. Prenez rendez-vous Paramedic. Brossez-vous les dents avec une brosse  dents  poils doux. Passez dlicatement du fil dentaire entre vos dents. Notez toutes vos questions. Posez-les lors des visites prnatales. Pour plus d'informations American Pregnancy Association (Association amricaine de la grossesse) : americanpregnancy.org Cel[employer] Corporation of Obstetricians and Gynecologists (Collge amricain des obsttriciens et gyncologues) : https://www.todd-brady.net/ Office on Pitney Bowes (United Parcel la sant des femmes) : MightyReward.co.nz Pensions consultant un prestataire de soins de sant si vous avez : Des vertiges. De la fivre. De lgres crampes ou une pression au niveau du bassin ou une douleur persistante au niveau de votre abdomen. Des nauses, des vomissements  ou des diarrhes qui persistent pendant au moins  24 heures. Des pertes vaginales malodorantes. Mal quand vous urinez. t expose  ALLTEL Corporation, comme la varicelle, la rougeole, le virus [place], le VIH ou l'hpatite. Vous devez consulter immdiatement si vous avez : Des pertes de sang ou des saignements vaginaux. Des crampes ou des douleurs abdominales intenses. Un essoufflement ou [employer]Mc[employer] poitrine. Subi un traumatisme quelconque, comme celui d  Lennar Corporation  un accident de voiture. Raytheon, un odme ou une rougeur d'apparition rcente ou d'intensit accrue dans un bras ou une [place]. Rsum Le premier trimestre de grossesse commence Devon Energy premier jour de vos dernires rgles et se termine  la fin de la 12e semaine (du 1er mois au 3e mois). Pour aider  soulager les nauses et les vomissements, faites 4 ou 5 petits repas par jour plutt que 3 repas copieux. N'utilisez pas de News Corporation tabac ou de la nicotine, tels que les cigarettes, les cigarettes lectroniques et Recruitment consultant. Si vous avez besoin d'aide pour arrter de fumer, demandez conseil  votre prestataire de soins de sant. Rendez-vous  toutes vos visites de suivi. C'est important. Ces conseils et renseignements ne sauraient se substituer  l'avis mdical de votre prestataire de soins de sant. Par consquent, il est primordial deparler de toutes vos proccupations avec votre prestataire de soins de sant. Document Revised: 11/11/2019 Document Reviewed: 11/11/2019 Elsevier Patient Education  2022 [employer].

## 2021-01-05 ENCOUNTER — Encounter: Payer: Self-pay | Admitting: Obstetrics & Gynecology

## 2021-01-30 ENCOUNTER — Other Ambulatory Visit: Payer: Self-pay

## 2021-01-30 ENCOUNTER — Ambulatory Visit: Attending: Obstetrics

## 2021-01-30 DIAGNOSIS — O359XX0 Maternal care for (suspected) fetal abnormality and damage, unspecified, not applicable or unspecified: Secondary | ICD-10-CM | POA: Diagnosis not present

## 2021-01-30 DIAGNOSIS — Z363 Encounter for antenatal screening for malformations: Secondary | ICD-10-CM

## 2021-01-30 DIAGNOSIS — Z3490 Encounter for supervision of normal pregnancy, unspecified, unspecified trimester: Secondary | ICD-10-CM | POA: Diagnosis not present

## 2021-01-30 DIAGNOSIS — Z3A19 19 weeks gestation of pregnancy: Secondary | ICD-10-CM

## 2021-05-18 ENCOUNTER — Other Ambulatory Visit: Payer: Self-pay

## 2021-05-18 ENCOUNTER — Inpatient Hospital Stay (HOSPITAL_COMMUNITY): Payer: Medicaid Other

## 2021-05-18 ENCOUNTER — Encounter (HOSPITAL_COMMUNITY): Payer: Self-pay | Admitting: Obstetrics and Gynecology

## 2021-05-18 ENCOUNTER — Inpatient Hospital Stay (HOSPITAL_COMMUNITY)
Admission: AD | Admit: 2021-05-18 | Discharge: 2021-05-22 | DRG: 831 | Disposition: A | Discharge status: Home / Self Care | Payer: Medicaid Other | Attending: Family Medicine | Admitting: Family Medicine

## 2021-05-18 ENCOUNTER — Inpatient Hospital Stay (HOSPITAL_BASED_OUTPATIENT_CLINIC_OR_DEPARTMENT_OTHER): Payer: Medicaid Other

## 2021-05-18 DIAGNOSIS — Z3A34 34 weeks gestation of pregnancy: Secondary | ICD-10-CM

## 2021-05-18 DIAGNOSIS — O2300 Infections of kidney in pregnancy, unspecified trimester: Secondary | ICD-10-CM | POA: Insufficient documentation

## 2021-05-18 DIAGNOSIS — I959 Hypotension, unspecified: Secondary | ICD-10-CM | POA: Diagnosis not present

## 2021-05-18 DIAGNOSIS — O368190 Decreased fetal movements, unspecified trimester, not applicable or unspecified: Secondary | ICD-10-CM

## 2021-05-18 DIAGNOSIS — O99113 Other diseases of the blood and blood-forming organs and certain disorders involving the immune mechanism complicating pregnancy, third trimester: Secondary | ICD-10-CM | POA: Diagnosis not present

## 2021-05-18 DIAGNOSIS — N1330 Unspecified hydronephrosis: Secondary | ICD-10-CM | POA: Diagnosis not present

## 2021-05-18 DIAGNOSIS — E876 Hypokalemia: Secondary | ICD-10-CM | POA: Diagnosis present

## 2021-05-18 DIAGNOSIS — O26899 Other specified pregnancy related conditions, unspecified trimester: Secondary | ICD-10-CM | POA: Diagnosis not present

## 2021-05-18 DIAGNOSIS — O2303 Infections of kidney in pregnancy, third trimester: Secondary | ICD-10-CM | POA: Diagnosis not present

## 2021-05-18 DIAGNOSIS — R519 Headache, unspecified: Secondary | ICD-10-CM | POA: Diagnosis not present

## 2021-05-18 DIAGNOSIS — R109 Unspecified abdominal pain: Secondary | ICD-10-CM | POA: Diagnosis not present

## 2021-05-18 DIAGNOSIS — Z419 Encounter for procedure for purposes other than remedying health state, unspecified: Secondary | ICD-10-CM | POA: Diagnosis not present

## 2021-05-18 DIAGNOSIS — O98613 Protozoal diseases complicating pregnancy, third trimester: Secondary | ICD-10-CM | POA: Diagnosis not present

## 2021-05-18 DIAGNOSIS — Z3689 Encounter for other specified antenatal screening: Secondary | ICD-10-CM | POA: Diagnosis not present

## 2021-05-18 DIAGNOSIS — O368131 Decreased fetal movements, third trimester, fetus 1: Secondary | ICD-10-CM | POA: Diagnosis not present

## 2021-05-18 DIAGNOSIS — O368130 Decreased fetal movements, third trimester, not applicable or unspecified: Secondary | ICD-10-CM | POA: Diagnosis not present

## 2021-05-18 DIAGNOSIS — D696 Thrombocytopenia, unspecified: Secondary | ICD-10-CM | POA: Diagnosis not present

## 2021-05-18 DIAGNOSIS — Z23 Encounter for immunization: Secondary | ICD-10-CM | POA: Diagnosis not present

## 2021-05-18 DIAGNOSIS — B509 Plasmodium falciparum malaria, unspecified: Secondary | ICD-10-CM | POA: Diagnosis present

## 2021-05-18 DIAGNOSIS — O99283 Endocrine, nutritional and metabolic diseases complicating pregnancy, third trimester: Secondary | ICD-10-CM | POA: Diagnosis not present

## 2021-05-18 DIAGNOSIS — B54 Unspecified malaria: Secondary | ICD-10-CM | POA: Diagnosis present

## 2021-05-18 DIAGNOSIS — O99013 Anemia complicating pregnancy, third trimester: Secondary | ICD-10-CM | POA: Diagnosis present

## 2021-05-18 DIAGNOSIS — Z20822 Contact with and (suspected) exposure to covid-19: Secondary | ICD-10-CM | POA: Diagnosis not present

## 2021-05-18 DIAGNOSIS — Z743 Need for continuous supervision: Secondary | ICD-10-CM | POA: Diagnosis not present

## 2021-05-18 DIAGNOSIS — O0933 Supervision of pregnancy with insufficient antenatal care, third trimester: Secondary | ICD-10-CM | POA: Diagnosis not present

## 2021-05-18 DIAGNOSIS — R531 Weakness: Secondary | ICD-10-CM | POA: Diagnosis not present

## 2021-05-18 HISTORY — DX: Supervision of pregnancy with insufficient antenatal care, third trimester: O09.33

## 2021-05-18 LAB — COMPREHENSIVE METABOLIC PANEL
ALT: 24 U/L (ref 0–44)
ALT: 20 U/L (ref 0–44)
AST: 48 U/L — ABNORMAL HIGH (ref 15–41)
AST: 40 U/L (ref 15–41)
Albumin: 2.8 g/dL — ABNORMAL LOW (ref 3.5–5.0)
Albumin: 2.4 g/dL — ABNORMAL LOW (ref 3.5–5.0)
Alkaline Phosphatase: 175 U/L — ABNORMAL HIGH (ref 38–126)
Alkaline Phosphatase: 142 U/L — ABNORMAL HIGH (ref 38–126)
Anion gap: 10 (ref 5–15)
Anion gap: 9 (ref 5–15)
BUN: 7 mg/dL (ref 6–20)
BUN: 7 mg/dL (ref 6–20)
CO2: 22 mmol/L (ref 22–32)
CO2: 19 mmol/L — ABNORMAL LOW (ref 22–32)
Calcium: 8.7 mg/dL — ABNORMAL LOW (ref 8.9–10.3)
Calcium: 8.3 mg/dL — ABNORMAL LOW (ref 8.9–10.3)
Chloride: 102 mmol/L (ref 98–111)
Chloride: 105 mmol/L (ref 98–111)
Creatinine, Ser: 0.73 mg/dL (ref 0.44–1.00)
Creatinine, Ser: 0.58 mg/dL (ref 0.44–1.00)
GFR, Estimated: >60 mL/min (ref >60)
GFR, Estimated: >60 mL/min (ref >60)
Glucose, Bld: 92 mg/dL (ref 70–99)
Glucose, Bld: 112 mg/dL — ABNORMAL HIGH (ref 70–99)
Potassium: 3.2 mmol/L — ABNORMAL LOW (ref 3.5–5.1)
Potassium: 3.4 mmol/L — ABNORMAL LOW (ref 3.5–5.1)
Sodium: 134 mmol/L — ABNORMAL LOW (ref 135–145)
Sodium: 133 mmol/L — ABNORMAL LOW (ref 135–145)
Total Bilirubin: 1.8 mg/dL — ABNORMAL HIGH (ref 0.3–1.2)
Total Bilirubin: 1.8 mg/dL — ABNORMAL HIGH (ref 0.3–1.2)
Total Protein: 6.8 g/dL (ref 6.5–8.1)
Total Protein: 5.9 g/dL — ABNORMAL LOW (ref 6.5–8.1)

## 2021-05-18 LAB — DIC (DISSEMINATED INTRAVASCULAR COAGULATION)PANEL
D-Dimer, Quant: 3.65 ug/mL-FEU — ABNORMAL HIGH (ref 0.00–0.50)
Fibrinogen: 594 mg/dL — ABNORMAL HIGH (ref 210–475)
INR: 1 (ref 0.8–1.2)
Platelets: 49 10*3/uL — ABNORMAL LOW (ref 150–400)
Prothrombin Time: 13.7 seconds (ref 11.4–15.2)
Smear Review: NONE SEEN
aPTT: 29 seconds (ref 24–36)

## 2021-05-18 LAB — CBC
HCT: 30.1 % — ABNORMAL LOW (ref 36.0–46.0)
HCT: 24.4 % — ABNORMAL LOW (ref 36.0–46.0)
HCT: 34.1 % — ABNORMAL LOW (ref 36.0–46.0)
Hemoglobin: 9.7 g/dL — ABNORMAL LOW (ref 12.0–15.0)
Hemoglobin: 11.4 g/dL — ABNORMAL LOW (ref 12.0–15.0)
Hemoglobin: 8.2 g/dL — ABNORMAL LOW (ref 12.0–15.0)
MCH: 29.6 pg (ref 26.0–34.0)
MCH: 29.8 pg (ref 26.0–34.0)
MCH: 30 pg (ref 26.0–34.0)
MCHC: 32.2 g/dL (ref 30.0–36.0)
MCHC: 33.4 g/dL (ref 30.0–36.0)
MCHC: 33.6 g/dL (ref 30.0–36.0)
MCV: 91.8 fL (ref 80.0–100.0)
MCV: 89 fL (ref 80.0–100.0)
MCV: 89.4 fL (ref 80.0–100.0)
Platelets: UNDETERMINED 10*3/uL (ref 150–400)
Platelets: 35 10*3/uL — ABNORMAL LOW (ref 150–400)
Platelets: 43 10*3/uL — ABNORMAL LOW (ref 150–400)
RBC: 3.28 MIL/uL — ABNORMAL LOW (ref 3.87–5.11)
RBC: 2.73 MIL/uL — ABNORMAL LOW (ref 3.87–5.11)
RBC: 3.83 MIL/uL — ABNORMAL LOW (ref 3.87–5.11)
RDW: 13.9 % (ref 11.5–15.5)
RDW: 12.7 % (ref 11.5–15.5)
RDW: 14 % (ref 11.5–15.5)
WBC: 4.8 10*3/uL (ref 4.0–10.5)
WBC: 4.2 10*3/uL (ref 4.0–10.5)
WBC: 5.7 10*3/uL (ref 4.0–10.5)
nRBC: 0 % (ref 0.0–0.2)
nRBC: 0 % (ref 0.0–0.2)

## 2021-05-18 LAB — URINALYSIS, ROUTINE W REFLEX MICROSCOPIC
Glucose, UA: NEGATIVE mg/dL
Ketones, ur: NEGATIVE mg/dL
Leukocytes,Ua: NEGATIVE
Nitrite: NEGATIVE
Protein, ur: 100 mg/dL — AB
Specific Gravity, Urine: 1.01 (ref 1.005–1.030)
pH: 6.5 (ref 5.0–8.0)

## 2021-05-18 LAB — DIRECT ANTIGLOBULIN TEST (NOT AT ARMC)
DAT, IgG: NEGATIVE
DAT, complement: NEGATIVE

## 2021-05-18 LAB — PREPARE RBC (CROSSMATCH)

## 2021-05-18 LAB — URINALYSIS, MICROSCOPIC (REFLEX)

## 2021-05-18 LAB — RESP PANEL BY RT-PCR (FLU A&B, COVID) ARPGX2
Influenza A by PCR: NEGATIVE
Influenza B by PCR: NEGATIVE
SARS Coronavirus 2 by RT PCR: NEGATIVE

## 2021-05-18 LAB — SAVE SMEAR(SSMR), FOR PROVIDER SLIDE REVIEW

## 2021-05-18 MED ORDER — SODIUM CHLORIDE 0.9 % IV SOLN
2.0000 g | INTRAVENOUS | Status: DC
  Filled 2021-05-18: qty 20

## 2021-05-18 MED ORDER — SODIUM CHLORIDE 0.9% IV SOLUTION: Freq: Once | INTRAVENOUS | Status: DC

## 2021-05-18 MED ORDER — DOCUSATE SODIUM 100 MG PO CAPS: 100.0000 mg | ORAL_CAPSULE | Freq: Two times a day (BID) | ORAL | Status: DC | PRN

## 2021-05-18 MED ORDER — LACTATED RINGERS IV BOLUS
1000.0000 mL | Freq: Once | INTRAVENOUS | Status: AC
  Administered 2021-05-18: 1000 mL via INTRAVENOUS

## 2021-05-18 MED ORDER — PRENATAL MULTIVITAMIN CH
1.0000 | ORAL_TABLET | Freq: Every day | ORAL | Status: DC
  Administered 2021-05-19 – 2021-05-22 (×4): 1 via ORAL
  Filled 2021-05-18 (×4): qty 1

## 2021-05-18 MED ORDER — DIPHENHYDRAMINE HCL 50 MG/ML IJ SOLN
25.0000 mg | Freq: Once | INTRAMUSCULAR | Status: AC
  Administered 2021-05-18: 25 mg via INTRAVENOUS
  Filled 2021-05-18: qty 1

## 2021-05-18 MED ORDER — ARTEMETHER-LUMEFANTRINE 20-120 MG PO TABS
4.0000 | ORAL_TABLET | Freq: Three times a day (TID) | ORAL | Status: DC
  Administered 2021-05-19: 4 via ORAL
  Filled 2021-05-18 (×2): qty 4

## 2021-05-18 MED ORDER — METOCLOPRAMIDE HCL 5 MG/ML IJ SOLN
10.0000 mg | Freq: Once | INTRAMUSCULAR | Status: AC
  Administered 2021-05-18: 10 mg via INTRAVENOUS
  Filled 2021-05-18: qty 2

## 2021-05-18 MED ORDER — DEXAMETHASONE SODIUM PHOSPHATE 10 MG/ML IJ SOLN
10.0000 mg | Freq: Once | INTRAMUSCULAR | Status: AC
  Administered 2021-05-18: 10 mg via INTRAVENOUS
  Filled 2021-05-18: qty 1

## 2021-05-18 MED ORDER — POTASSIUM CHLORIDE IN NACL 40-0.9 MEQ/L-% IV SOLN
INTRAVENOUS | Status: DC
Start: 2021-05-18 — End: 2021-05-19
  Filled 2021-05-18 (×2): qty 1000

## 2021-05-18 MED ORDER — ACETAMINOPHEN 500 MG PO TABS
1000.0000 mg | ORAL_TABLET | Freq: Four times a day (QID) | ORAL | Status: DC | PRN
  Administered 2021-05-18 – 2021-05-19 (×2): 1000 mg via ORAL
  Filled 2021-05-18 (×2): qty 2

## 2021-05-18 MED ORDER — CALCIUM CARBONATE ANTACID 500 MG PO CHEW: 2.0000 | CHEWABLE_TABLET | ORAL | Status: DC | PRN

## 2021-05-18 MED ORDER — ARTEMETHER-LUMEFANTRINE 20-120 MG PO TABS: 4.0000 | ORAL_TABLET | Freq: Two times a day (BID) | ORAL | Status: DC

## 2021-05-18 MED ORDER — FENTANYL CITRATE (PF) 100 MCG/2ML IJ SOLN
50.0000 ug | Freq: Once | INTRAMUSCULAR | Status: AC
  Administered 2021-05-18: 50 ug via INTRAVENOUS
  Filled 2021-05-18: qty 2

## 2021-05-18 MED ORDER — ONDANSETRON HCL 4 MG/2ML IJ SOLN: 4.0000 mg | Freq: Four times a day (QID) | INTRAMUSCULAR | Status: DC | PRN

## 2021-05-18 NOTE — H&P (Addendum)
OBSTETRIC ADMISSION HISTORY AND PHYSICAL  Linda Caldwell is a 25 y.o. female G2P0010 @34 .5 wks presenting via EMS with HA and hip pain. Reports onset of bilateral hip pain yesterday. Hurts to walk. Rates pain 8/10. Endorses body aches also. HA started today. Located frontal. Rates pain 8/10. She took Phenergan for the pain and had some relief. Reports feeling feverish today at home, did not check temp. Denies sick contacts. Had and episode of diarrhea and vomiting 2 days ago. Endorses decreased FM today. Denies VB, LOF, or ctx. MAU evaluation>persistent decreased FM, BPP 8/10, and low grade fever.  Dating: By LMP --->  Estimated Date of Delivery: 06/24/21  Sono:    @[redacted]w[redacted]d , normal anatomy, 65%ile   Prenatal History/Complications: - limited prenatal care - UTI in first trimester-treated - language barrier  Past Medical History: Past Medical History:  Diagnosis Date   Medical history non-contributory     Past Surgical History: Past Surgical History:  Procedure Laterality Date   NO PAST SURGERIES      Obstetrical History: OB History     Gravida  2   Para      Term      Preterm      AB  1   Living  0      SAB  1   IAB      Ectopic      Multiple      Live Births              Social History: Social History   Socioeconomic History   Marital status: Married    Spouse name: Not on file   Number of children: 0   Years of education: Not on file   Highest education level: Not on file  Occupational History   Not on file  Tobacco Use   Smoking status: Never   Smokeless tobacco: Never  Vaping Use   Vaping Use: Never used  Substance and Sexual Activity   Alcohol use: Not Currently   Drug use: Never   Sexual activity: Yes  Other Topics Concern   Not on file  Social History Narrative   Not on file   Social Determinants of Health   Financial Resource Strain: Not on file  Food Insecurity: Not on file  Transportation Needs: Not on file  Physical  Activity: Not on file  Stress: Not on file  Social Connections: Not on file    Family History: Family History  Problem Relation Age of Onset   Hyperlipidemia Maternal Aunt     Allergies: No Known Allergies  Medications Prior to Admission  Medication Sig Dispense Refill Last Dose   acetaminophen (TYLENOL) 500 MG tablet Take 500 mg by mouth every 6 (six) hours as needed for headache, fever or moderate pain.      famotidine (PEPCID) 20 MG tablet Take 1 tablet (20 mg total) by mouth 2 (two) times daily. 60 tablet 5    metroNIDAZOLE (FLAGYL) 500 MG tablet Take 1 tablet (500 mg total) by mouth 2 (two) times daily. 14 tablet 2    nitrofurantoin, macrocrystal-monohydrate, (MACROBID) 100 MG capsule Take 1 capsule (100 mg total) by mouth 2 (two) times daily. 1 po BID x 7days 14 capsule 2    Prenatal Vit-Fe Phos-FA-Omega (VITAFOL GUMMIES) 3.33-0.333-34.8 MG CHEW Chew 3 tablets by mouth daily before breakfast. 90 tablet 11    terconazole (TERAZOL 7) 0.4 % vaginal cream Place 1 applicator vaginally at bedtime. 45 g 0    Review of Systems:  All systems reviewed and negative except as stated in HPI  PE: Blood pressure 124/69, pulse (!) 112, temperature 99 F (37.2 C), resp. rate 16, last menstrual period 09/17/2020, SpO2 100 %. General appearance: alert, cooperative, and no distress Lungs: regular rate and effort Heart: regular rate  Abdomen: soft, non-tender, CVAT neg Extremities: Homans sign is negative, no sign of DVT Neuro: cranial nerves intact EFM: 150 bpm, mod variability, + accels, occ decels Toco: none Dilation: Closed Effacement (%): Thick Exam by:: Donette Larry, CNM  Prenatal labs: ABO, Rh: A/Positive/-- (06/09 1421) Antibody: Negative (06/09 1421) Rubella: 23.80 (06/09 1421) RPR: Non Reactive (06/09 1421)  HBsAg: Negative (06/09 1421)  HIV: Non Reactive (06/09 1421)  GBS:  n/a 2 hr GTT not done  Prenatal Transfer Tool  Maternal Diabetes: unknown Genetic  Screening: Declined Maternal Ultrasounds/Referrals: Normal Fetal Ultrasounds or other Referrals:  None Maternal Substance Abuse:  No Significant Maternal Medications:  None Significant Maternal Lab Results: None  Results for orders placed or performed during the hospital encounter of 05/18/21 (from the past 24 hour(s))  Resp Panel by RT-PCR (Flu A&B, Covid) Nasopharyngeal Swab   Collection Time: 05/18/21  4:17 PM   Specimen: Nasopharyngeal Swab; Nasopharyngeal(NP) swabs in vial transport medium  Result Value Ref Range   SARS Coronavirus 2 by RT PCR NEGATIVE NEGATIVE   Influenza A by PCR NEGATIVE NEGATIVE   Influenza B by PCR NEGATIVE NEGATIVE  CBC   Collection Time: 05/18/21  4:32 PM  Result Value Ref Range   WBC 4.8 4.0 - 10.5 K/uL   RBC 3.28 (L) 3.87 - 5.11 MIL/uL   Hemoglobin 9.7 (L) 12.0 - 15.0 g/dL   HCT 62.9 (L) 52.8 - 41.3 %   MCV 91.8 80.0 - 100.0 fL   MCH 29.6 26.0 - 34.0 pg   MCHC 32.2 30.0 - 36.0 g/dL   RDW 24.4 01.0 - 27.2 %   Platelets PLATELET CLUMPS NOTED ON SMEAR, UNABLE TO ESTIMATE 150 - 400 K/uL   nRBC 0.0 0.0 - 0.2 %  Comprehensive metabolic panel   Collection Time: 05/18/21  4:32 PM  Result Value Ref Range   Sodium 134 (L) 135 - 145 mmol/L   Potassium 3.2 (L) 3.5 - 5.1 mmol/L   Chloride 102 98 - 111 mmol/L   CO2 22 22 - 32 mmol/L   Glucose, Bld 92 70 - 99 mg/dL   BUN 7 6 - 20 mg/dL   Creatinine, Ser 5.36 0.44 - 1.00 mg/dL   Calcium 8.7 (L) 8.9 - 10.3 mg/dL   Total Protein 6.8 6.5 - 8.1 g/dL   Albumin 2.8 (L) 3.5 - 5.0 g/dL   AST 48 (H) 15 - 41 U/L   ALT 24 0 - 44 U/L   Alkaline Phosphatase 175 (H) 38 - 126 U/L   Total Bilirubin 1.8 (H) 0.3 - 1.2 mg/dL   GFR, Estimated >64 >40 mL/min   Anion gap 10 5 - 15  Urinalysis, Routine w reflex microscopic Urine, Clean Catch   Collection Time: 05/18/21  4:42 PM  Result Value Ref Range   Color, Urine YELLOW YELLOW   APPearance CLEAR CLEAR   Specific Gravity, Urine 1.010 1.005 - 1.030   pH 6.5 5.0 - 8.0    Glucose, UA NEGATIVE NEGATIVE mg/dL   Hgb urine dipstick LARGE (A) NEGATIVE   Bilirubin Urine SMALL (A) NEGATIVE   Ketones, ur NEGATIVE NEGATIVE mg/dL   Protein, ur 347 (A) NEGATIVE mg/dL   Nitrite NEGATIVE NEGATIVE   Leukocytes,Ua  NEGATIVE NEGATIVE  Urinalysis, Microscopic (reflex)   Collection Time: 05/18/21  4:42 PM  Result Value Ref Range   RBC / HPF 0-5 0 - 5 RBC/hpf   WBC, UA 0-5 0 - 5 WBC/hpf   Bacteria, UA RARE (A) NONE SEEN   Squamous Epithelial / LPF 0-5 0 - 5    Patient Active Problem List   Diagnosis Date Noted   Pyelonephritis affecting pregnancy 05/18/2021   Language barrier 12/08/2020   Supervision of normal pregnancy, antepartum 11/23/2020   Assessment: [redacted] weeks gestation Presumed pyelonephritis Hypokalemia Decreased fetal movement  Plan: Admit to [facility] Rocephin KCL Renal US Mngt per Dr. Robie Ridge, CNM  05/18/2021, 7:51 PM

## 2021-05-18 NOTE — Progress Notes (Addendum)
OB Note  2130 At bedside due to episodes of low fetal hr. Bedside u/s done and confirms episodes in the 90s and then with spontaneous recovery; +incidental fetal breathing. With interpreter I d/w her prelim smear showing malaria. Last OB visit was in June. Patient states that she just got back yesterday from [place] and s/s started today.  I also d/w her prn need for blood products and she states that she would be amenable to this.  ID called and recommend baseline ecg and clinda and quinine therapy, with baseline ecg and then cardiac monitoring due to the quinine. They also recommend parasite smear now and in the morning along with cbc and cmp.  MFM called and will do research and let me know rcs  2230 D/w MFM and they recommend coartem per CDC guidelines. I d/w pharmacy and there is coartem available and will start with 4 tabs po per dose and day 1 with first dose and then repeat in eight hours and then day 2 bid and day 3 bid. C-section consent signed with patient.   Cornelia Copa MD Attending Center for [facility] (Faculty Practice) GYN Consult Phone: [phone] (M-F, [phone]) & [phone] (Off hours, weekends, holidays)

## 2021-05-18 NOTE — MAU Note (Signed)
Pt reports headache.   Pt reports lower abdominal pain and can not walk.   Pt reports this has been going on for 4 days.

## 2021-05-19 ENCOUNTER — Inpatient Hospital Stay (HOSPITAL_BASED_OUTPATIENT_CLINIC_OR_DEPARTMENT_OTHER): Payer: Medicaid Other

## 2021-05-19 DIAGNOSIS — O98613 Protozoal diseases complicating pregnancy, third trimester: Secondary | ICD-10-CM | POA: Diagnosis not present

## 2021-05-19 DIAGNOSIS — Z3689 Encounter for other specified antenatal screening: Secondary | ICD-10-CM | POA: Diagnosis not present

## 2021-05-19 DIAGNOSIS — B54 Unspecified malaria: Secondary | ICD-10-CM | POA: Diagnosis not present

## 2021-05-19 DIAGNOSIS — O368131 Decreased fetal movements, third trimester, fetus 1: Secondary | ICD-10-CM

## 2021-05-19 DIAGNOSIS — Z3A34 34 weeks gestation of pregnancy: Secondary | ICD-10-CM

## 2021-05-19 DIAGNOSIS — O2303 Infections of kidney in pregnancy, third trimester: Secondary | ICD-10-CM

## 2021-05-19 HISTORY — DX: Unspecified malaria: B54

## 2021-05-19 LAB — PARASITE EXAM SCREEN, BLOOD-W CONF TO LABCORP (NOT @ ARMC)

## 2021-05-19 LAB — CBC WITH DIFFERENTIAL/PLATELET
Abs Immature Granulocytes: 0.07 10*3/uL (ref 0.00–0.07)
Basophils Absolute: 0 10*3/uL (ref 0.0–0.1)
Basophils Relative: 0 %
Eosinophils Absolute: 0 10*3/uL (ref 0.0–0.5)
Eosinophils Relative: 0 %
HCT: 23.7 % — ABNORMAL LOW (ref 36.0–46.0)
Hemoglobin: 8 g/dL — ABNORMAL LOW (ref 12.0–15.0)
Immature Granulocytes: 1 %
Lymphocytes Relative: 18 %
Lymphs Abs: 1 10*3/uL (ref 0.7–4.0)
MCH: 30.5 pg (ref 26.0–34.0)
MCHC: 33.8 g/dL (ref 30.0–36.0)
MCV: 90.5 fL (ref 80.0–100.0)
Monocytes Absolute: 0.3 10*3/uL (ref 0.1–1.0)
Monocytes Relative: 5 %
Neutro Abs: 4.2 10*3/uL (ref 1.7–7.7)
Neutrophils Relative %: 76 %
Platelets: 38 10*3/uL — ABNORMAL LOW (ref 150–400)
RBC: 2.62 MIL/uL — ABNORMAL LOW (ref 3.87–5.11)
RDW: 14.3 % (ref 11.5–15.5)
WBC: 5.6 10*3/uL (ref 4.0–10.5)
nRBC: 0 /100 WBC

## 2021-05-19 LAB — CULTURE, OB URINE: Culture: NO GROWTH

## 2021-05-19 LAB — GLUCOSE, CAPILLARY
Glucose-Capillary: 148 mg/dL — ABNORMAL HIGH (ref 70–99)
Glucose-Capillary: 131 mg/dL — ABNORMAL HIGH (ref 70–99)
Glucose-Capillary: 126 mg/dL — ABNORMAL HIGH (ref 70–99)
Glucose-Capillary: 198 mg/dL — ABNORMAL HIGH (ref 70–99)

## 2021-05-19 LAB — COMPREHENSIVE METABOLIC PANEL
ALT: 20 U/L (ref 0–44)
AST: 31 U/L (ref 15–41)
Albumin: 2.2 g/dL — ABNORMAL LOW (ref 3.5–5.0)
Alkaline Phosphatase: 139 U/L — ABNORMAL HIGH (ref 38–126)
Anion gap: 4 — ABNORMAL LOW (ref 5–15)
BUN: 8 mg/dL (ref 6–20)
CO2: 22 mmol/L (ref 22–32)
Calcium: 8.5 mg/dL — ABNORMAL LOW (ref 8.9–10.3)
Chloride: 110 mmol/L (ref 98–111)
Creatinine, Ser: 0.47 mg/dL (ref 0.44–1.00)
GFR, Estimated: >60 mL/min (ref >60)
Glucose, Bld: 149 mg/dL — ABNORMAL HIGH (ref 70–99)
Potassium: 4 mmol/L (ref 3.5–5.1)
Sodium: 136 mmol/L (ref 135–145)
Total Bilirubin: 0.5 mg/dL (ref 0.3–1.2)
Total Protein: 5.6 g/dL — ABNORMAL LOW (ref 6.5–8.1)

## 2021-05-19 LAB — HIV ANTIBODY (ROUTINE TESTING W REFLEX): HIV Screen 4th Generation wRfx: NONREACTIVE

## 2021-05-19 MED ORDER — SODIUM CHLORIDE 0.9% IV SOLUTION: Freq: Once | INTRAVENOUS | Status: AC

## 2021-05-19 MED ORDER — ARTEMETHER-LUMEFANTRINE 20-120 MG PO TABS
4.0000 | ORAL_TABLET | Freq: Three times a day (TID) | ORAL | Status: AC
  Administered 2021-05-20 – 2021-05-21 (×2): 4 via ORAL
  Filled 2021-05-19 (×2): qty 4

## 2021-05-19 MED ORDER — ARTESUNATE 110 MG IV SOLR (NON-CDC SUPPLY)
2.4000 mg/kg | Freq: Two times a day (BID) | INTRAVENOUS | Status: AC
  Administered 2021-05-19 – 2021-05-20 (×3): 163.7 mg via INTRAVENOUS
  Filled 2021-05-19 (×3): qty 16.37

## 2021-05-19 MED ORDER — POTASSIUM CHLORIDE 2 MEQ/ML IV SOLN
INTRAVENOUS | Status: DC
  Filled 2021-05-19 (×11): qty 1000

## 2021-05-19 MED ORDER — ARTEMETHER-LUMEFANTRINE 20-120 MG PO TABS
4.0000 | ORAL_TABLET | Freq: Two times a day (BID) | ORAL | Status: DC
  Administered 2021-05-21 – 2021-05-22 (×3): 4 via ORAL
  Filled 2021-05-19 (×3): qty 4

## 2021-05-19 MED ORDER — INFLUENZA VAC SPLIT QUAD 0.5 ML IM SUSY
0.5000 mL | PREFILLED_SYRINGE | INTRAMUSCULAR | Status: AC
  Administered 2021-05-22: 0.5 mL via INTRAMUSCULAR
  Filled 2021-05-19: qty 0.5

## 2021-05-19 NOTE — Progress Notes (Signed)
OB Note Lab called due to a hgb of 8. RN to transfuse 1U PRBC.   Cornelia Copa MD Attending Center for [employer] (Faculty Practice) 05/19/2021 Time: [phone]

## 2021-05-19 NOTE — Consult Note (Addendum)
MFM Note  Linda Caldwell is a 25 year old gravida 2 para 0 currently at 60 and 6 weeks with an EDC of June 24, 2021.  She presented to the hospital yesterday as she was not feeling well along with complaints of headache, myalgias, and hip pain.  She also had a low-grade fever.  The patient has a significant travel history where has been in [place] (a country in [place]) for the past 2 months.  She returned to the [place] about 2 days ago.  She reports that she did have mosquito bites in [place]. She was not taking any medications for malarial prophylaxis.  She reports that she had a negative test for malaria in [place] last month.  On admission, she had a blood smear/parasite screen that was positive for Plasmodium.  The screen has been sent to a reference lab for identification and speciation.  Due to the diagnosis of malaria, she was started on oral Coartem for treatment last night.  On admission, the patient was noted to be severely anemic with an H/H of 8 and 23.7.  Her platelet count is also low varying from 43,000-38,000.  Her serum creatinine level and liver function tests are within normal limits.  Due to severe anemia, she received a blood transfusion this morning.  Her fetal status has been reassuring.  She had a growth ultrasound performed today showing an EFW of 5 pounds 8 ounces (41st percentile).    There was normal amniotic fluid with a total AFI of 9.1 cm.    The fetus is in the vertex presentation.    A normal-appearing anterior placenta is noted.  Her past OB history includes a first trimester miscarriage.  She denies any significant past medical or surgical history.  The following were discussed with the patient today:  Malaria in pregnancy  Malaria is caused by the parasite Plasmodium.  The 4 species of Plasmodium that cause human malaria are vivax, ovale, malarie, and falciparum.   Malaria is the most common human parasitic disease.  Clinical findings  include fevers, chills, and flulike symptoms including headaches, myalgia, and malaise.  It may also be associated with anemia and jaundice.   Malaria infection in pregnancy is often confused with HELLP syndrome, as low platelets and anemia are often noted in both conditions.  Anemia associated with malaria is primarily caused by splenic sequestration of the infected erythrocytes and sequestration of erythrocytes in the placenta.  Infections with the Plasmodium Falciparum species is more commonly associated with severe morbidity and mortality and have been associated with kidney failure, coma, and death.  Identification of parasites by microscopic evaluation of the blood smear is considered the gold standard for diagnosis.  Infected erythrocytes can accumulate in the vascular areas of the placenta.  High levels of placental parasitemia correlate with increased risk of stillbirth, preterm delivery, and fetal growth restriction.  According to the CDC, the recommended treatment for pregnant patients with uncomplicated malaria is Coartem (Artemether-lumefantrine) in the second and third trimesters.  In discussion with the infectious disease team, due to the large amount of parasites in her blood, they will switch the patient from oral Coartem to IV artesunate.   They will continue to follow her blood smears every 24 hours to monitor the parasite density and her response to treatment.  It is anticipated that she will require at least 3 doses of IV artesunate for treatment.  The infectious disease team will switch her to oral therapy once she completes her IV therapy.  As the patient's blood pressures have been within normal limits, I highly doubt that her low platelet count and anemia is due to HELLP syndrome.  However, she should have a 24-hour urine collected to ensure that she does not have significant proteinuria.  As pregnant women who have coexisting HIV are at highest risk for morbidity and  mortality secondary to a malarial infection, she should have a repeat HIV test drawn.  Once she has completed the treatment for her malaria infection and is discharged home, we will continue to follow her with weekly fetal testing.  Delivery may be considered at 39 weeks should her fetal status remain reassuring.  The patient stated that all of her questions have been answered today.  All conversations were held with the patient today with the help of a [name] interpreter.  Recommendations:  Continue treatment for malaria as recommended by infectious disease 24-hour urine collection HIV test Weekly BPP's once she is discharged Delivery at 39 weeks

## 2021-05-19 NOTE — Consult Note (Addendum)
[facility]    Date of Admission:  05/18/2021   Total days of inpatient antibiotics 1        Reason for Consult: malaria    Principal Problem:   Pyelonephritis affecting pregnancy Active Problems:   Insufficient prenatal care in third trimester   Assessment: 25 YO G2P0010 F at [redacted]w[redacted]d admitted for presumed pyelonephritis based on clinical presentation which turned out to be malarial infecton. Found to have plasmodium on smear and started on coartem, ID consulted for further management.   #Malaria in a G2P0010 female at [redacted]w[redacted]d -Will start treatment for severe malaria. After review of slides with pathology, Dr. Charm Barges, multiple parasitic cells seen. Although we do not have a percent parasitemia, I suspect it may be greater than 5%. As she is pregnant in third trimester she is at a higher chance of poor outcomes.   Recommendations:  -Start Artesunate.  -I spoke with [facility] and they should be able to update percent parasitemia today/tomorrow.I have placed orders for Caortem to start after the third dose of artesunate. In case parasitemia doesn't return, 3 doses of artesunate should be sufficient. Depending of parasitemia and clinical course may reconsider transition to PO treatment.  -It should be noted that her malaria treatment plan is based on the the thought that she is non-immune. Given that she had been in the [place] for months prior to returning to [place]. In addition, pregnancy reduces malarial immunity.  - Obtain blood smear(parasite exam) q12h x 3 days. parasite  Should clear within 72h. Clearance smear can be obtained outpatient   Microbiology:   Antibiotics: Coartem 12/1 Artesunate 12/2  Cultures: Blood 12/1 NGTD Parasite blood smear    HPI: Linda Caldwell is a 25 y.o. G2P0010 female at 34 weeks and 6d presented with headache and bilateral hip pain. She reports her headache was 8/10 with associated body aches starting day piror to  presentation. She also reported hip pain along with lower abdominal pain. She had dirrhea and vomited at home for thepast two days. On arrival pt had temp 100.6,plt 43 Interpreter was used while speaking to patient. Husband at bedside. She emigrated form [place] in 2021 to the [place]. She traveled to [place] from end of September and returned about two days prior to admission. She reports about three weeks into her stay at [place] she was tested for malaria which was negative. She states it was part of her prenatal care. She did not take malaria prophylaxis. She had had prior episodes of malaria but it was "long time ago." Today, she reports feeling well. She does have  low back pain which is improving. Parasite exam screm revealed plasmodium or other blood parasites on 12/1.      Review of Systems: Review of Systems  All other systems reviewed and are negative.  Past Medical History:  Diagnosis Date   Medical history non-contributory     Social History   Tobacco Use   Smoking status: Never   Smokeless tobacco: Never  Vaping Use   Vaping Use: Never used  Substance Use Topics   Alcohol use: Not Currently   Drug use: Never    Family History  Problem Relation Age of Onset   Hyperlipidemia Maternal Aunt    Scheduled Meds:  sodium chloride   Intravenous Once   artesunate  2.4 mg/kg Intravenous Q12H   [START ON 05/20/2021] influenza vac split quadrivalent PF  0.5 mL Intramuscular Tomorrow-1000   prenatal  multivitamin  1 tablet Oral Q1200   Continuous Infusions:  dextrose 5 % lactated ringers with kcl 125 mL/hr at 05/19/21 0941   PRN Meds:.acetaminophen, calcium carbonate, docusate sodium, ondansetron (ZOFRAN) IV No Known Allergies  OBJECTIVE: Blood pressure (!) 101/58, pulse 88, temperature 98.5 F (36.9 C), temperature source Oral, resp. rate 16, height 5\' 3"  (1.6 m), weight 68.2 kg, last menstrual period 09/17/2020, SpO2 94 %.  Physical Exam Constitutional:      Appearance: Normal  appearance.  HENT:     Head: Normocephalic and atraumatic.     Right Ear: Tympanic membrane normal.     Left Ear: Tympanic membrane normal.     Nose: Nose normal.     Mouth/Throat:     Mouth: Mucous membranes are moist.  Eyes:     Extraocular Movements: Extraocular movements intact.     Conjunctiva/sclera: Conjunctivae normal.     Pupils: Pupils are equal, round, and reactive to light.  Cardiovascular:     Rate and Rhythm: Normal rate and regular rhythm.     Heart sounds: No murmur heard.   No friction rub. No gallop.  Pulmonary:     Effort: Pulmonary effort is normal.     Breath sounds: Normal breath sounds.  Abdominal:     Palpations: Abdomen is soft.     Comments: Gravid  Musculoskeletal:        General: Normal range of motion.  Skin:    General: Skin is warm and dry.  Neurological:     General: No focal deficit present.     Mental Status: She is alert and oriented to person, place, and time.  Psychiatric:        Mood and Affect: Mood normal.    Lab Results Lab Results  Component Value Date   WBC 5.6 05/19/2021   HGB 8.0 (L) 05/19/2021   HCT 23.7 (L) 05/19/2021   MCV 90.5 05/19/2021   PLT 38 (L) 05/19/2021    Lab Results  Component Value Date   CREATININE 0.47 05/19/2021   BUN 8 05/19/2021   NA 136 05/19/2021   K 4.0 05/19/2021   CL 110 05/19/2021   CO2 22 05/19/2021    Lab Results  Component Value Date   ALT 20 05/19/2021   AST 31 05/19/2021   ALKPHOS 139 (H) 05/19/2021   BILITOT 0.5 05/19/2021       14/07/2020, MD [facility] 05/19/2021, 12:41 PM

## 2021-05-19 NOTE — Progress Notes (Signed)
FACULTY PRACTICE ANTEPARTUM PROGRESS NOTE  Linda Caldwell is a 25 y.o. G2P0010 at [redacted]w[redacted]d who is admitted for myalgias now diagnosed with malaria.  Estimated Date of Delivery: 06/24/21 Fetal presentation is cephalic.  Length of Stay:  1 Days. Admitted 05/18/2021  Subjective: During evaluation Infectious disease and maternal fetal medicine were both present.  ID relayed treatment plan for malaria and will switch to IV medication for at least 3 doses.   Patient reports normal fetal movement.  She denies uterine contractions, denies bleeding and leaking of fluid per vagina.  Vitals:  Blood pressure (!) 101/58, pulse 88, temperature 98.5 F (36.9 C), temperature source Oral, resp. rate 16, height  (1.6 m), weight 68.2 kg, last menstrual period 09/17/2020, SpO2 94 %. Physical Examination: CONSTITUTIONAL: Well-developed, well-nourished female in no acute distress.  HENT:  Normocephalic, atraumatic, External right and left ear normal. Oropharynx is clear and moist EYES: Conjunctivae and EOM are normal.  NECK: Normal range of motion, supple, no masses. SKIN: Skin is warm and dry. No rash noted. Not diaphoretic. No erythema. No pallor. NEUROLGIC: Alert and oriented to person, place, and time. Normal reflexes, muscle tone coordination. No cranial nerve deficit noted. PSYCHIATRIC: Normal mood and affect. Normal behavior. Normal judgment and thought content. CARDIOVASCULAR: Normal heart rate noted, regular rhythm RESPIRATORY: Effort and breath sounds normal, no problems with respiration noted MUSCULOSKELETAL: Normal range of motion. No edema and no tenderness. ABDOMEN: Soft, nontender, nondistended, gravid. CERVIX: deferred  Fetal monitoring: FHR: 120s bpm, Variability: moderate, Accelerations: Present, Decelerations: Absent  Uterine activity: none   Results for orders placed or performed during the hospital encounter of 05/18/21 (from the past 48 hour(s))  Resp Panel by RT-PCR (Flu A&B, Covid)  Nasopharyngeal Swab     Status: None   Collection Time: 05/18/21  4:17 PM   Specimen: Nasopharyngeal Swab; Nasopharyngeal(NP) swabs in vial transport medium  Result Value Ref Range   SARS Coronavirus 2 by RT PCR NEGATIVE NEGATIVE    Comment: (NOTE) SARS-CoV-2 target nucleic acids are NOT DETECTED.  The SARS-CoV-2 RNA is generally detectable in upper respiratory specimens during the acute phase of infection. The lowest concentration of SARS-CoV-2 viral copies this assay can detect is 138 copies/mL. A negative result does not preclude SARS-Cov-2 infection and should not be used as the sole basis for treatment or other patient management decisions. A negative result may occur with  improper specimen collection/handling, submission of specimen other than nasopharyngeal swab, presence of viral mutation(s) within the areas targeted by this assay, and inadequate number of viral copies(<138 copies/mL). A negative result must be combined with clinical observations, patient history, and epidemiological information. The expected result is Negative.  Fact Sheet for Patients:  BloggerCourse.com  Fact Sheet for Healthcare Providers:  SeriousBroker.it  This test is no t yet approved or cleared by the [place] FDA and  has been authorized for detection and/or diagnosis of SARS-CoV-2 by FDA under an Emergency Use Authorization (EUA). This EUA will remain  in effect (meaning this test can be used) for the duration of the COVID-19 declaration under Section 564(b)(1) of the Act, 21 U.S.C.section 360bbb-3(b)(1), unless the authorization is terminated  or revoked sooner.       Influenza A by PCR NEGATIVE NEGATIVE   Influenza B by PCR NEGATIVE NEGATIVE    Comment: (NOTE) The Xpert Xpress SARS-CoV-2/FLU/RSV plus assay is intended as an aid in the diagnosis of influenza from Nasopharyngeal swab specimens and should not be used as a sole basis for  treatment. Nasal washings and aspirates are unacceptable for Xpert Xpress SARS-CoV-2/FLU/RSV testing.  Fact Sheet for Patients: BloggerCourse.com  Fact Sheet for Healthcare Providers: SeriousBroker.it  This test is not yet approved or cleared by the [place] FDA and has been authorized for detection and/or diagnosis of SARS-CoV-2 by FDA under an Emergency Use Authorization (EUA). This EUA will remain in effect (meaning this test can be used) for the duration of the COVID-19 declaration under Section 564(b)(1) of the Act, 21 U.S.C. section 360bbb-3(b)(1), unless the authorization is terminated or revoked.  Performed at [facility], [address], [place], [place] [address]   CBC     Status: Abnormal   Collection Time: 05/18/21  4:32 PM  Result Value Ref Range   WBC 4.8 4.0 - 10.5 K/uL   RBC 3.28 (L) 3.87 - 5.11 MIL/uL   Hemoglobin 9.7 (L) 12.0 - 15.0 g/dL   HCT 49.6 (L) 75.9 - 16.3 %   MCV 91.8 80.0 - 100.0 fL   MCH 29.6 26.0 - 34.0 pg   MCHC 32.2 30.0 - 36.0 g/dL   RDW 84.6 65.9 - 93.5 %   Platelets PLATELET CLUMPS NOTED ON SMEAR, UNABLE TO ESTIMATE 150 - 400 K/uL    Comment: Immature Platelet Fraction may be clinically indicated, consider ordering this additional test [id number]    nRBC 0.0 0.0 - 0.2 %    Comment: Performed at [facility], [address] [place], [place] [address]  Comprehensive metabolic panel     Status: Abnormal   Collection Time: 05/18/21  4:32 PM  Result Value Ref Range   Sodium 134 (L) 135 - 145 mmol/L   Potassium 3.2 (L) 3.5 - 5.1 mmol/L   Chloride 102 98 - 111 mmol/L   CO2 22 22 - 32 mmol/L   Glucose, Bld 92 70 - 99 mg/dL    Comment: Glucose reference range applies only to samples taken after fasting for at least 8 hours.   BUN 7 6 - 20 mg/dL   Creatinine, Ser 2.33 0.44 - 1.00 mg/dL   Calcium 8.7 (L) 8.9 - 10.3 mg/dL   Total Protein 6.8 6.5 - 8.1 g/dL   Albumin 2.8 (L)  3.5 - 5.0 g/dL   AST 48 (H) 15 - 41 U/L   ALT 24 0 - 44 U/L   Alkaline Phosphatase 175 (H) 38 - 126 U/L   Total Bilirubin 1.8 (H) 0.3 - 1.2 mg/dL   GFR, Estimated >00 >76 mL/min    Comment: (NOTE) Calculated using the CKD-EPI Creatinine Equation (2021)    Anion gap 10 5 - 15    Comment: Performed at [facility], [address] [place], [place] [address]  Urinalysis, Routine w reflex microscopic Urine, Clean Catch     Status: Abnormal   Collection Time: 05/18/21  4:42 PM  Result Value Ref Range   Color, Urine YELLOW YELLOW   APPearance CLEAR CLEAR   Specific Gravity, Urine 1.010 1.005 - 1.030   pH 6.5 5.0 - 8.0   Glucose, UA NEGATIVE NEGATIVE mg/dL   Hgb urine dipstick LARGE (A) NEGATIVE   Bilirubin Urine SMALL (A) NEGATIVE   Ketones, ur NEGATIVE NEGATIVE mg/dL   Protein, ur 354 (A) NEGATIVE mg/dL   Nitrite NEGATIVE NEGATIVE   Leukocytes,Ua NEGATIVE NEGATIVE    Comment: Performed at [facility], [address] [place], [place] [address]  Urinalysis, Microscopic (reflex)     Status: Abnormal   Collection Time: 05/18/21  4:42 PM  Result Value Ref Range   RBC / HPF 0-5 0 - 5 RBC/hpf   WBC, UA 0-5 0 - 5 WBC/hpf   Bacteria, UA RARE (A) NONE SEEN   Squamous Epithelial / LPF 0-5 0 - 5    Comment: Performed at [facility], [address] [place], [place] [address]  CBC     Status: Abnormal   Collection Time: 05/18/21  8:00 PM  Result Value Ref Range   WBC 5.7 4.0 - 10.5 K/uL   RBC 2.73 (L) 3.87 - 5.11 MIL/uL   Hemoglobin 8.2 (L) 12.0 - 15.0 g/dL   HCT 59.1 (L) 63.8 - 46.6 %   MCV 89.4 80.0 - 100.0 fL   MCH 30.0 26.0 - 34.0 pg   MCHC 33.6 30.0 - 36.0 g/dL   RDW 59.9 35.7 - 01.7 %   Platelets 43 (L) 150 - 400 K/uL    Comment: REPEATED TO VERIFY SPECIMEN CHECKED FOR CLOTS PLATELETS APPEAR DECREASED RARE PLATELET CLUMPS NOTED CALLED TO MELANIE MD 2005 05/18/2021 WBOND Performed at [facility], [address], [place], [place] [address]   Type and  screen [facility]     Status: None (Preliminary result)   Collection Time: 05/18/21  8:27 PM  Result Value Ref Range   ABO/RH(D) A POS    Antibody Screen NEG    Sample Expiration 05/21/2021,2359    Unit Number [id number]    Blood Component Type RED CELLS,LR    Unit division 00    Status of Unit ISSUED    Transfusion Status OK TO TRANSFUSE    Crossmatch Result      Compatible Performed at [facility], [address] [place], [place] [address]    Unit Number [id number]    Blood Component Type RED CELLS,LR    Unit division 00    Status of Unit ALLOCATED    Transfusion Status OK TO TRANSFUSE    Crossmatch Result Compatible   Direct antiglobulin test (not at [facility])     Status: None   Collection Time: 05/18/21  8:27 PM  Result Value Ref Range   DAT, complement NEG    DAT, IgG      NEG Performed at [facility], [address] [place], [place] [address]   DIC Panel ONCE - STAT     Status: Abnormal   Collection Time: 05/18/21  8:27 PM  Result Value Ref Range   Prothrombin Time 13.7 11.4 - 15.2 seconds   INR 1.0 0.8 - 1.2    Comment: (NOTE) INR goal varies based on device and disease states.    aPTT 29 24 - 36 seconds   Fibrinogen 594 (H) 210 - 475 mg/dL    Comment: (NOTE) Fibrinogen results may be underestimated in patients receiving thrombolytic therapy.    D-Dimer, Quant 3.65 (H) 0.00 - 0.50 ug/mL-FEU    Comment: (NOTE) At the manufacturer cut-off value of 0.5 g/mL FEU, this assay has a negative predictive value of 95-100%.This assay is intended for use in conjunction with a clinical pretest probability (PTP) assessment model to exclude pulmonary embolism (PE) and deep venous thrombosis (DVT) in outpatients suspected of PE or DVT. Results should be correlated with clinical presentation.    Platelets 49 (L) 150 - 400 K/uL    Comment: Immature Platelet Fraction may be clinically indicated, consider ordering this additional  test [id number] REPEATED TO VERIFY PLATELET COUNT CONFIRMED BY SMEAR  Smear Review NO SCHISTOCYTES SEEN     Comment: Performed at [facility], [address], [place], [place] [address]  Save Smear     Status: None   Collection Time: 05/18/21  8:27 PM  Result Value Ref Range   Smear Review SMEAR STAINED AND AVAILABLE FOR REVIEW     Comment: Performed at [facility], [address] [place], [place] [address]  CBC     Status: Abnormal   Collection Time: 05/18/21  8:27 PM  Result Value Ref Range   WBC 4.2 4.0 - 10.5 K/uL   RBC 3.83 (L) 3.87 - 5.11 MIL/uL   Hemoglobin 11.4 (L) 12.0 - 15.0 g/dL   HCT 53.2 (L) 99.2 - 42.6 %   MCV 89.0 80.0 - 100.0 fL   MCH 29.8 26.0 - 34.0 pg   MCHC 33.4 30.0 - 36.0 g/dL   RDW 83.4 19.6 - 22.2 %   Platelets 35 (L) 150 - 400 K/uL    Comment: Immature Platelet Fraction may be clinically indicated, consider ordering this additional test [id number] REPEATED TO VERIFY PLATELET COUNT CONFIRMED BY SMEAR    nRBC 0.0 0.0 - 0.2 %    Comment: Performed at [facility], [address], [place], [place] [address]  Comprehensive metabolic panel     Status: Abnormal   Collection Time: 05/18/21  8:27 PM  Result Value Ref Range   Sodium 133 (L) 135 - 145 mmol/L   Potassium 3.4 (L) 3.5 - 5.1 mmol/L   Chloride 105 98 - 111 mmol/L   CO2 19 (L) 22 - 32 mmol/L   Glucose, Bld 112 (H) 70 - 99 mg/dL    Comment: Glucose reference range applies only to samples taken after fasting for at least 8 hours.   BUN 7 6 - 20 mg/dL   Creatinine, Ser 0.81 0.44 - 1.00 mg/dL   Calcium 8.3 (L) 8.9 - 10.3 mg/dL   Total Protein 5.9 (L) 6.5 - 8.1 g/dL   Albumin 2.4 (L) 3.5 - 5.0 g/dL   AST 40 15 - 41 U/L   ALT 20 0 - 44 U/L   Alkaline Phosphatase 142 (H) 38 - 126 U/L   Total Bilirubin 1.8 (H) 0.3 - 1.2 mg/dL   GFR, Estimated >44 >81 mL/min    Comment: (NOTE) Calculated using the CKD-EPI Creatinine Equation (2021)    Anion gap 9 5 - 15    Comment: Performed  at [facility], [address] [place], [place] [address]  Parasite Exam Screen, Blood-w conf to [facility] (Not @ [facility])     Status: None   Collection Time: 05/18/21  8:27 PM  Result Value Ref Range   Parasite Exam Screen, Blood            Comment: Plasmodium or other Blood Parasites seen on thin smears. Sent to Reference Laboratory for identification and speciation. CRITICAL RESULT CALLED TO, READ BACK BY AND VERIFIED WITH: CHARLIE PICKENS,MD AWARE 05/18/2021 2130 P. NGUYEN Performed at [facility], [address], [place], [place] [address]   Culture, blood (routine x 2)     Status: None (Preliminary result)   Collection Time: 05/18/21  8:28 PM   Specimen: BLOOD  Result Value Ref Range   Specimen Description BLOOD RIGHT ANTECUBITAL    Special Requests      BOTTLES DRAWN AEROBIC AND ANAEROBIC Blood Culture adequate volume   Culture      NO GROWTH < 12  HOURS Performed at [facility], [address] [place], [place] [address]    Report Status PENDING   Culture, blood (routine x 2)     Status: None (Preliminary result)   Collection Time: 05/18/21  8:28 PM   Specimen: BLOOD RIGHT HAND  Result Value Ref Range   Specimen Description BLOOD RIGHT HAND    Special Requests      BOTTLES DRAWN AEROBIC AND ANAEROBIC Blood Culture adequate volume   Culture      NO GROWTH < 12 HOURS Performed at [facility], [address], [place], [place] [address]    Report Status PENDING   Prepare RBC (crossmatch)     Status: None   Collection Time: 05/18/21 11:30 PM  Result Value Ref Range   Order Confirmation      ORDER PROCESSED BY BLOOD BANK Performed at [facility], [address], [place], [place] [address]   Glucose, capillary     Status: Abnormal   Collection Time: 05/19/21  4:27 AM  Result Value Ref Range   Glucose-Capillary 148 (H) 70 - 99 mg/dL    Comment: Glucose reference range applies only to samples taken after fasting for at least 8 hours.   Comprehensive metabolic panel     Status: Abnormal   Collection Time: 05/19/21  5:01 AM  Result Value Ref Range   Sodium 136 135 - 145 mmol/L   Potassium 4.0 3.5 - 5.1 mmol/L   Chloride 110 98 - 111 mmol/L   CO2 22 22 - 32 mmol/L   Glucose, Bld 149 (H) 70 - 99 mg/dL    Comment: Glucose reference range applies only to samples taken after fasting for at least 8 hours.   BUN 8 6 - 20 mg/dL   Creatinine, Ser 5.57 0.44 - 1.00 mg/dL   Calcium 8.5 (L) 8.9 - 10.3 mg/dL   Total Protein 5.6 (L) 6.5 - 8.1 g/dL   Albumin 2.2 (L) 3.5 - 5.0 g/dL   AST 31 15 - 41 U/L   ALT 20 0 - 44 U/L   Alkaline Phosphatase 139 (H) 38 - 126 U/L   Total Bilirubin 0.5 0.3 - 1.2 mg/dL   GFR, Estimated >32 >20 mL/min    Comment: (NOTE) Calculated using the CKD-EPI Creatinine Equation (2021)    Anion gap 4 (L) 5 - 15    Comment: Performed at [facility], [address], [place], [place] [address]  CBC with Differential     Status: Abnormal   Collection Time: 05/19/21  5:01 AM  Result Value Ref Range   WBC 5.6 4.0 - 10.5 K/uL   RBC 2.62 (L) 3.87 - 5.11 MIL/uL   Hemoglobin 8.0 (L) 12.0 - 15.0 g/dL    Comment: REPEATED TO VERIFY DISSCUSSED RESULTS WITH RN Artelia Laroche OK, TO RELEASE 05/19/21 0615 P. NGUYEN    HCT 23.7 (L) 36.0 - 46.0 %   MCV 90.5 80.0 - 100.0 fL   MCH 30.5 26.0 - 34.0 pg   MCHC 33.8 30.0 - 36.0 g/dL   RDW 06.2 37.6 - 28.3 %   Platelets 38 (L) 150 - 400 K/uL    Comment: REPEATED TO VERIFY CONSISTENT WITH PREVIOUS RESULT    Neutrophils Relative % 76 %   Neutro Abs 4.2 1.7 - 7.7 K/uL   Lymphocytes Relative 18 %   Lymphs Abs 1.0 0.7 - 4.0 K/uL   Monocytes Relative 5 %   Monocytes Absolute 0.3 0.1 -  1.0 K/uL   Eosinophils Relative 0 %   Eosinophils Absolute 0.0 0.0 - 0.5 K/uL   Basophils Relative 0 %   Basophils Absolute 0.0 0.0 - 0.1 K/uL   nRBC 0 0 /100 WBC   Immature Granulocytes 1 %   Abs Immature Granulocytes 0.07 0.00 - 0.07 K/uL    Comment: Performed at [facility]  Lab, [address], [place], [place] [address]  Parasite Exam Screen, Blood-w conf to [facility] (Not @ [facility])     Status: None   Collection Time: 05/19/21  5:01 AM  Result Value Ref Range   Parasite Exam Screen, Blood            Comment: Plasmodium or other Blood Parasites seen on thin smears. Sent to Reference Laboratory for identification and speciation. CRITICAL RESULT CALLED TO, READ BACK BY AND VERIFIED WITH: MEGAN HOPPENBAUER,RN AT 0739 05/19/2021 BY ZBEECH. Performed at [facility], [address] [place], [place] [address]   Glucose, capillary     Status: Abnormal   Collection Time: 05/19/21  8:42 AM  Result Value Ref Range   Glucose-Capillary 131 (H) 70 - 99 mg/dL    Comment: Glucose reference range applies only to samples taken after fasting for at least 8 hours.  Glucose, capillary     Status: Abnormal   Collection Time: 05/19/21  1:27 PM  Result Value Ref Range   Glucose-Capillary 126 (H) 70 - 99 mg/dL    Comment: Glucose reference range applies only to samples taken after fasting for at least 8 hours.    I have reviewed the patient's current medications.  ASSESSMENT: Principal Problem:   Pyelonephritis affecting pregnancy Active Problems:   Insufficient prenatal care in third trimester   Malaria during pregnancy, third trimester Malaria in pregnancy third trimester  PLAN: Will defer to ID team for treatment and management of malaria.  At this time IV Artesunate.  Pt is s/p po coartem with good response. Pt will receive daily peripheral smears to eval parasite load HIV and 24 hour urine ordered per MFM recommendations NST BID Delivery at 37-39 weeks  Continue routine antenatal care.   Mariel Aloe, MD [facility] Faculty Attending, [facility] [place] 05/19/2021 2:17 PM

## 2021-05-20 DIAGNOSIS — Z3A34 34 weeks gestation of pregnancy: Secondary | ICD-10-CM | POA: Diagnosis not present

## 2021-05-20 DIAGNOSIS — O98613 Protozoal diseases complicating pregnancy, third trimester: Secondary | ICD-10-CM | POA: Diagnosis not present

## 2021-05-20 DIAGNOSIS — O368131 Decreased fetal movements, third trimester, fetus 1: Secondary | ICD-10-CM | POA: Diagnosis not present

## 2021-05-20 DIAGNOSIS — B54 Unspecified malaria: Secondary | ICD-10-CM | POA: Diagnosis not present

## 2021-05-20 LAB — CBC WITH DIFFERENTIAL/PLATELET
Abs Immature Granulocytes: 0.12 10*3/uL — ABNORMAL HIGH (ref 0.00–0.07)
Basophils Absolute: 0 10*3/uL (ref 0.0–0.1)
Basophils Relative: 1 %
Eosinophils Absolute: 0.1 10*3/uL (ref 0.0–0.5)
Eosinophils Relative: 1 %
HCT: 25.5 % — ABNORMAL LOW (ref 36.0–46.0)
Hemoglobin: 8.6 g/dL — ABNORMAL LOW (ref 12.0–15.0)
Immature Granulocytes: 2 %
Lymphocytes Relative: 38 %
Lymphs Abs: 2 10*3/uL (ref 0.7–4.0)
MCH: 30.1 pg (ref 26.0–34.0)
MCHC: 33.7 g/dL (ref 30.0–36.0)
MCV: 89.2 fL (ref 80.0–100.0)
Monocytes Absolute: 0.4 10*3/uL (ref 0.1–1.0)
Monocytes Relative: 8 %
Neutro Abs: 2.7 10*3/uL (ref 1.7–7.7)
Neutrophils Relative %: 50 %
Platelets: 42 10*3/uL — ABNORMAL LOW (ref 150–400)
RBC: 2.86 MIL/uL — ABNORMAL LOW (ref 3.87–5.11)
RDW: 15 % (ref 11.5–15.5)
Smear Review: DECREASED
WBC: 5.3 10*3/uL (ref 4.0–10.5)
nRBC: 3.4 % — ABNORMAL HIGH (ref 0.0–0.2)

## 2021-05-20 LAB — PARASITE EXAM, BLOOD
Parasite Exam, Blood: POSITIVE — AB
Parasite Exam, Blood: POSITIVE — AB

## 2021-05-20 LAB — GLUCOSE, CAPILLARY
Glucose-Capillary: 107 mg/dL — ABNORMAL HIGH (ref 70–99)
Glucose-Capillary: 115 mg/dL — ABNORMAL HIGH (ref 70–99)
Glucose-Capillary: 129 mg/dL — ABNORMAL HIGH (ref 70–99)
Glucose-Capillary: 135 mg/dL — ABNORMAL HIGH (ref 70–99)
Glucose-Capillary: 86 mg/dL (ref 70–99)
Glucose-Capillary: 86 mg/dL (ref 70–99)
Glucose-Capillary: 88 mg/dL (ref 70–99)

## 2021-05-20 LAB — PROTEIN, URINE, 24 HOUR
Collection Interval-UPROT: 24 hours
Protein, 24H Urine: 558 mg/d — ABNORMAL HIGH (ref 50–100)
Protein, Urine: 31 mg/dL
Urine Total Volume-UPROT: 1800 mL

## 2021-05-20 NOTE — Progress Notes (Signed)
FACULTY PRACTICE ANTEPARTUM PROGRESS NOTE  Linda Caldwell is a 25 y.o. G2P0010 at [redacted]w[redacted]d who is admitted for malaria in pregnancy.  Estimated Date of Delivery: 06/24/21 Fetal presentation is cephalic.  Length of Stay:  2 Days. Admitted 05/18/2021  Subjective: Pt has rested well overnight.  She has received 3 doses of her anti-malarial IV medication.  Pt denies any body aches or myalgias,  She denies fever/chills.   Patient reports normal fetal movement.  She denies uterine contractions, denies bleeding and leaking of fluid per vagina.  Vitals:  Blood pressure 108/61, pulse 90, temperature 98.6 F (37 C), temperature source Oral, resp. rate 16, height  (1.6 m), weight 68.2 kg, last menstrual period 09/17/2020, SpO2 100 %. Physical Examination: CONSTITUTIONAL: Well-developed, well-nourished female in no acute distress.  HENT:  Normocephalic, atraumatic, External right and left ear normal. Oropharynx is clear and moist EYES: Conjunctivae and EOM are normal.  NECK: Normal range of motion, supple, no masses. SKIN: Skin is warm and dry. No rash noted. Not diaphoretic. No erythema. No pallor. NEUROLGIC: Alert and oriented to person, place, and time. Normal reflexes, muscle tone coordination. No cranial nerve deficit noted. PSYCHIATRIC: Normal mood and affect. Normal behavior. Normal judgment and thought content. CARDIOVASCULAR: Normal heart rate noted, regular rhythm RESPIRATORY: Effort and breath sounds normal, no problems with respiration noted MUSCULOSKELETAL: Normal range of motion. No edema and no tenderness. ABDOMEN: Soft, nontender, nondistended, gravid. CERVIX: deferred  Fetal monitoring: FHR: 110's bpm low baseline noted, Variability: marked, Accelerations: Present, Decelerations: Absent  Uterine activity: none, category 1 strip Results for orders placed or performed during the hospital encounter of 05/18/21 (from the past 48 hour(s))  Resp Panel by RT-PCR (Flu A&B, Covid)  Nasopharyngeal Swab     Status: None   Collection Time: 05/18/21  4:17 PM   Specimen: Nasopharyngeal Swab; Nasopharyngeal(NP) swabs in vial transport medium  Result Value Ref Range   SARS Coronavirus 2 by RT PCR NEGATIVE NEGATIVE    Comment: (NOTE) SARS-CoV-2 target nucleic acids are NOT DETECTED.  The SARS-CoV-2 RNA is generally detectable in upper respiratory specimens during the acute phase of infection. The lowest concentration of SARS-CoV-2 viral copies this assay can detect is 138 copies/mL. A negative result does not preclude SARS-Cov-2 infection and should not be used as the sole basis for treatment or other patient management decisions. A negative result may occur with  improper specimen collection/handling, submission of specimen other than nasopharyngeal swab, presence of viral mutation(s) within the areas targeted by this assay, and inadequate number of viral copies(<138 copies/mL). A negative result must be combined with clinical observations, patient history, and epidemiological information. The expected result is Negative.  Fact Sheet for Patients:  BloggerCourse.com  Fact Sheet for Healthcare Providers:  SeriousBroker.it  This test is no t yet approved or cleared by the [place] FDA and  has been authorized for detection and/or diagnosis of SARS-CoV-2 by FDA under an Emergency Use Authorization (EUA). This EUA will remain  in effect (meaning this test can be used) for the duration of the COVID-19 declaration under Section 564(b)(1) of the Act, 21 U.S.C.section 360bbb-3(b)(1), unless the authorization is terminated  or revoked sooner.       Influenza A by PCR NEGATIVE NEGATIVE   Influenza B by PCR NEGATIVE NEGATIVE    Comment: (NOTE) The Xpert Xpress SARS-CoV-2/FLU/RSV plus assay is intended as an aid in the diagnosis of influenza from Nasopharyngeal swab specimens and should not be used as a sole basis for  treatment. Nasal washings and aspirates are unacceptable for Xpert Xpress SARS-CoV-2/FLU/RSV testing.  Fact Sheet for Patients: BloggerCourse.com  Fact Sheet for Healthcare Providers: SeriousBroker.it  This test is not yet approved or cleared by the [place] FDA and has been authorized for detection and/or diagnosis of SARS-CoV-2 by FDA under an Emergency Use Authorization (EUA). This EUA will remain in effect (meaning this test can be used) for the duration of the COVID-19 declaration under Section 564(b)(1) of the Act, 21 U.S.C. section 360bbb-3(b)(1), unless the authorization is terminated or revoked.  Performed at [facility], [address], [place], [place] [address]   CBC     Status: Abnormal   Collection Time: 05/18/21  4:32 PM  Result Value Ref Range   WBC 4.8 4.0 - 10.5 K/uL   RBC 3.28 (L) 3.87 - 5.11 MIL/uL   Hemoglobin 9.7 (L) 12.0 - 15.0 g/dL   HCT 49.6 (L) 75.9 - 16.3 %   MCV 91.8 80.0 - 100.0 fL   MCH 29.6 26.0 - 34.0 pg   MCHC 32.2 30.0 - 36.0 g/dL   RDW 84.6 65.9 - 93.5 %   Platelets PLATELET CLUMPS NOTED ON SMEAR, UNABLE TO ESTIMATE 150 - 400 K/uL    Comment: Immature Platelet Fraction may be clinically indicated, consider ordering this additional test [id number]    nRBC 0.0 0.0 - 0.2 %    Comment: Performed at [facility], [address] [place], [place] [address]  Comprehensive metabolic panel     Status: Abnormal   Collection Time: 05/18/21  4:32 PM  Result Value Ref Range   Sodium 134 (L) 135 - 145 mmol/L   Potassium 3.2 (L) 3.5 - 5.1 mmol/L   Chloride 102 98 - 111 mmol/L   CO2 22 22 - 32 mmol/L   Glucose, Bld 92 70 - 99 mg/dL    Comment: Glucose reference range applies only to samples taken after fasting for at least 8 hours.   BUN 7 6 - 20 mg/dL   Creatinine, Ser 2.33 0.44 - 1.00 mg/dL   Calcium 8.7 (L) 8.9 - 10.3 mg/dL   Total Protein 6.8 6.5 - 8.1 g/dL   Albumin 2.8 (L)  3.5 - 5.0 g/dL   AST 48 (H) 15 - 41 U/L   ALT 24 0 - 44 U/L   Alkaline Phosphatase 175 (H) 38 - 126 U/L   Total Bilirubin 1.8 (H) 0.3 - 1.2 mg/dL   GFR, Estimated >00 >76 mL/min    Comment: (NOTE) Calculated using the CKD-EPI Creatinine Equation (2021)    Anion gap 10 5 - 15    Comment: Performed at [facility], [address] [place], [place] [address]  Urinalysis, Routine w reflex microscopic Urine, Clean Catch     Status: Abnormal   Collection Time: 05/18/21  4:42 PM  Result Value Ref Range   Color, Urine YELLOW YELLOW   APPearance CLEAR CLEAR   Specific Gravity, Urine 1.010 1.005 - 1.030   pH 6.5 5.0 - 8.0   Glucose, UA NEGATIVE NEGATIVE mg/dL   Hgb urine dipstick LARGE (A) NEGATIVE   Bilirubin Urine SMALL (A) NEGATIVE   Ketones, ur NEGATIVE NEGATIVE mg/dL   Protein, ur 354 (A) NEGATIVE mg/dL   Nitrite NEGATIVE NEGATIVE   Leukocytes,Ua NEGATIVE NEGATIVE    Comment: Performed at [facility], [address] [place], [place] [address]  Urinalysis, Microscopic (reflex)     Status: Abnormal   Collection Time: 05/18/21  4:42 PM  Result Value Ref Range   RBC / HPF 0-5 0 - 5 RBC/hpf   WBC, UA 0-5 0 - 5 WBC/hpf   Bacteria, UA RARE (A) NONE SEEN   Squamous Epithelial / LPF 0-5 0 - 5    Comment: Performed at [facility], [address], [place], [place] [address]  Culture, [place] Urine     Status: None   Collection Time: 05/18/21  5:45 PM   Specimen: Urine, Random  Result Value Ref Range   Specimen Description URINE, RANDOM    Special Requests NONE    Culture      NO GROWTH Performed at [facility], [address], [place], [place] [address]    Report Status 05/19/2021 FINAL   CBC     Status: Abnormal   Collection Time: 05/18/21  8:00 PM  Result Value Ref Range   WBC 5.7 4.0 - 10.5 K/uL   RBC 2.73 (L) 3.87 - 5.11 MIL/uL   Hemoglobin 8.2 (L) 12.0 - 15.0 g/dL   HCT 09.8 (L) 11.9 - 14.7 %   MCV 89.4 80.0 - 100.0 fL   MCH 30.0 26.0 - 34.0 pg   MCHC  33.6 30.0 - 36.0 g/dL   RDW 82.9 56.2 - 13.0 %   Platelets 43 (L) 150 - 400 K/uL    Comment: REPEATED TO VERIFY SPECIMEN CHECKED FOR CLOTS PLATELETS APPEAR DECREASED RARE PLATELET CLUMPS NOTED CALLED TO MELANIE MD 2005 05/18/2021 WBOND Performed at [facility], [address], [place], [place] [address]   Type and screen [facility]     Status: None (Preliminary result)   Collection Time: 05/18/21  8:27 PM  Result Value Ref Range   ABO/RH(D) A POS    Antibody Screen NEG    Sample Expiration 05/21/2021,2359    Unit Number [id number]    Blood Component Type RED CELLS,LR    Unit division 00    Status of Unit ISSUED    Transfusion Status OK TO TRANSFUSE    Crossmatch Result      Compatible Performed at [facility], [address], [place], [place] [address]    Unit Number [id number]    Blood Component Type RED CELLS,LR    Unit division 00    Status of Unit ALLOCATED    Transfusion Status OK TO TRANSFUSE    Crossmatch Result Compatible   Direct antiglobulin test (not at [facility])     Status: None   Collection Time: 05/18/21  8:27 PM  Result Value Ref Range   DAT, complement NEG    DAT, IgG      NEG Performed at [facility], [address], [place], [place] [address]   DIC Panel ONCE - STAT     Status: Abnormal   Collection Time: 05/18/21  8:27 PM  Result Value Ref Range   Prothrombin Time 13.7 11.4 - 15.2 seconds   INR 1.0 0.8 - 1.2    Comment: (NOTE) INR goal varies based on device and disease states.    aPTT 29 24 - 36 seconds   Fibrinogen 594 (H) 210 - 475 mg/dL    Comment: (NOTE) Fibrinogen results may be underestimated in patients receiving thrombolytic therapy.    D-Dimer, Quant 3.65 (H) 0.00 - 0.50 ug/mL-FEU    Comment: (NOTE) At the manufacturer cut-off value of 0.5 g/mL FEU, this assay has a negative predictive value of 95-100%.This assay is intended for use  in conjunction with a clinical pretest probability (PTP)  assessment model to exclude pulmonary embolism (PE) and deep venous thrombosis (DVT) in outpatients suspected of PE or DVT. Results should be correlated with clinical presentation.    Platelets 49 (L) 150 - 400 K/uL    Comment: Immature Platelet Fraction may be clinically indicated, consider ordering this additional test [id number] REPEATED TO VERIFY PLATELET COUNT CONFIRMED BY SMEAR    Smear Review NO SCHISTOCYTES SEEN     Comment: Performed at [facility], [address] [place], [place] [address]  Save Smear     Status: None   Collection Time: 05/18/21  8:27 PM  Result Value Ref Range   Smear Review SMEAR STAINED AND AVAILABLE FOR REVIEW     Comment: Performed at [facility], [address], [place], [place] [address]  CBC     Status: Abnormal   Collection Time: 05/18/21  8:27 PM  Result Value Ref Range   WBC 4.2 4.0 - 10.5 K/uL   RBC 3.83 (L) 3.87 - 5.11 MIL/uL   Hemoglobin 11.4 (L) 12.0 - 15.0 g/dL   HCT 78.2 (L) 95.6 - 21.3 %   MCV 89.0 80.0 - 100.0 fL   MCH 29.8 26.0 - 34.0 pg   MCHC 33.4 30.0 - 36.0 g/dL   RDW 08.6 57.8 - 46.9 %   Platelets 35 (L) 150 - 400 K/uL    Comment: Immature Platelet Fraction may be clinically indicated, consider ordering this additional test [id number] REPEATED TO VERIFY PLATELET COUNT CONFIRMED BY SMEAR    nRBC 0.0 0.0 - 0.2 %    Comment: Performed at [facility], [address], [place], [place] [address]  Comprehensive metabolic panel     Status: Abnormal   Collection Time: 05/18/21  8:27 PM  Result Value Ref Range   Sodium 133 (L) 135 - 145 mmol/L   Potassium 3.4 (L) 3.5 - 5.1 mmol/L   Chloride 105 98 - 111 mmol/L   CO2 19 (L) 22 - 32 mmol/L   Glucose, Bld 112 (H) 70 - 99 mg/dL    Comment: Glucose reference range applies only to samples taken after fasting for at least 8 hours.   BUN 7 6 - 20 mg/dL   Creatinine, Ser 1.02 0.44 - 1.00 mg/dL   Calcium 8.3 (L) 8.9 - 10.3 mg/dL   Total Protein 5.9 (L) 6.5 - 8.1  g/dL   Albumin 2.4 (L) 3.5 - 5.0 g/dL   AST 40 15 - 41 U/L   ALT 20 0 - 44 U/L   Alkaline Phosphatase 142 (H) 38 - 126 U/L   Total Bilirubin 1.8 (H) 0.3 - 1.2 mg/dL   GFR, Estimated >72 >53 mL/min    Comment: (NOTE) Calculated using the CKD-EPI Creatinine Equation (2021)    Anion gap 9 5 - 15    Comment: Performed at [facility], [address] [place], [place] [address]  Parasite Exam Screen, Blood-w conf to [facility] (Not @ [facility])     Status: None   Collection Time: 05/18/21  8:27 PM  Result Value Ref Range   Parasite Exam Screen, Blood            Comment: Plasmodium or other Blood Parasites seen on thin smears. Sent to Reference Laboratory for identification and speciation. CRITICAL RESULT CALLED TO, READ BACK BY AND VERIFIED WITH: CHARLIE PICKENS,MD AWARE 05/18/2021 2130 P. NGUYEN Performed at [facility], [address]  [place], [place] [address]   Culture, blood (routine x 2)     Status: None (Preliminary result)   Collection Time: 05/18/21  8:28 PM   Specimen: BLOOD  Result Value Ref Range   Specimen Description BLOOD RIGHT ANTECUBITAL    Special Requests      BOTTLES DRAWN AEROBIC AND ANAEROBIC Blood Culture adequate volume   Culture      NO GROWTH 2 DAYS Performed at [facility], [address] [place], [place] [address]    Report Status PENDING   Culture, blood (routine x 2)     Status: None (Preliminary result)   Collection Time: 05/18/21  8:28 PM   Specimen: BLOOD RIGHT HAND  Result Value Ref Range   Specimen Description BLOOD RIGHT HAND    Special Requests      BOTTLES DRAWN AEROBIC AND ANAEROBIC Blood Culture adequate volume   Culture      NO GROWTH 2 DAYS Performed at [facility], [address] [place], [place] [address]    Report Status PENDING   Prepare RBC (crossmatch)     Status: None   Collection Time: 05/18/21 11:30 PM  Result Value Ref Range   Order Confirmation      ORDER PROCESSED BY BLOOD BANK Performed at  [facility], [address] [place], [place] [address]   Glucose, capillary     Status: Abnormal   Collection Time: 05/19/21  4:27 AM  Result Value Ref Range   Glucose-Capillary 148 (H) 70 - 99 mg/dL    Comment: Glucose reference range applies only to samples taken after fasting for at least 8 hours.  Comprehensive metabolic panel     Status: Abnormal   Collection Time: 05/19/21  5:01 AM  Result Value Ref Range   Sodium 136 135 - 145 mmol/L   Potassium 4.0 3.5 - 5.1 mmol/L   Chloride 110 98 - 111 mmol/L   CO2 22 22 - 32 mmol/L   Glucose, Bld 149 (H) 70 - 99 mg/dL    Comment: Glucose reference range applies only to samples taken after fasting for at least 8 hours.   BUN 8 6 - 20 mg/dL   Creatinine, Ser 8.56 0.44 - 1.00 mg/dL   Calcium 8.5 (L) 8.9 - 10.3 mg/dL   Total Protein 5.6 (L) 6.5 - 8.1 g/dL   Albumin 2.2 (L) 3.5 - 5.0 g/dL   AST 31 15 - 41 U/L   ALT 20 0 - 44 U/L   Alkaline Phosphatase 139 (H) 38 - 126 U/L   Total Bilirubin 0.5 0.3 - 1.2 mg/dL   GFR, Estimated >31 >49 mL/min    Comment: (NOTE) Calculated using the CKD-EPI Creatinine Equation (2021)    Anion gap 4 (L) 5 - 15    Comment: Performed at [facility], [address] [place], [place] [address]  CBC with Differential     Status: Abnormal   Collection Time: 05/19/21  5:01 AM  Result Value Ref Range   WBC 5.6 4.0 - 10.5 K/uL   RBC 2.62 (L) 3.87 - 5.11 MIL/uL   Hemoglobin 8.0 (L) 12.0 - 15.0 g/dL    Comment: REPEATED TO VERIFY DISSCUSSED RESULTS WITH RN Artelia Laroche OK, TO RELEASE 05/19/21 0615 P. NGUYEN    HCT 23.7 (L) 36.0 - 46.0 %   MCV 90.5 80.0 - 100.0 fL   MCH 30.5 26.0 - 34.0 pg   MCHC 33.8 30.0 - 36.0 g/dL  RDW 14.3 11.5 - 15.5 %   Platelets 38 (L) 150 - 400 K/uL    Comment: REPEATED TO VERIFY CONSISTENT WITH PREVIOUS RESULT    Neutrophils Relative % 76 %   Neutro Abs 4.2 1.7 - 7.7 K/uL   Lymphocytes Relative 18 %   Lymphs Abs 1.0 0.7 - 4.0 K/uL   Monocytes Relative 5 %    Monocytes Absolute 0.3 0.1 - 1.0 K/uL   Eosinophils Relative 0 %   Eosinophils Absolute 0.0 0.0 - 0.5 K/uL   Basophils Relative 0 %   Basophils Absolute 0.0 0.0 - 0.1 K/uL   nRBC 0 0 /100 WBC   Immature Granulocytes 1 %   Abs Immature Granulocytes 0.07 0.00 - 0.07 K/uL    Comment: Performed at [facility], [address], [place], [place] [address]  Parasite Exam Screen, Blood-w conf to [facility] (Not @ [facility])     Status: None   Collection Time: 05/19/21  5:01 AM  Result Value Ref Range   Parasite Exam Screen, Blood            Comment: Plasmodium or other Blood Parasites seen on thin smears. Sent to Reference Laboratory for identification and speciation. CRITICAL RESULT CALLED TO, READ BACK BY AND VERIFIED WITH: MEGAN HOPPENBAUER,RN AT 0739 05/19/2021 BY ZBEECH. Performed at [facility], [address], [place], [place] [address]   Glucose, capillary     Status: Abnormal   Collection Time: 05/19/21  8:42 AM  Result Value Ref Range   Glucose-Capillary 131 (H) 70 - 99 mg/dL    Comment: Glucose reference range applies only to samples taken after fasting for at least 8 hours.  Glucose, capillary     Status: Abnormal   Collection Time: 05/19/21  1:27 PM  Result Value Ref Range   Glucose-Capillary 126 (H) 70 - 99 mg/dL    Comment: Glucose reference range applies only to samples taken after fasting for at least 8 hours.  HIV Antibody (routine testing w rflx)     Status: None   Collection Time: 05/19/21  5:33 PM  Result Value Ref Range   HIV Screen 4th Generation wRfx Non Reactive Non Reactive    Comment: Performed at [facility], [address] [place], [place] [address]  Glucose, capillary     Status: Abnormal   Collection Time: 05/19/21  7:52 PM  Result Value Ref Range   Glucose-Capillary 198 (H) 70 - 99 mg/dL    Comment: Glucose reference range applies only to samples taken after fasting for at least 8 hours.  Glucose, capillary     Status: Abnormal    Collection Time: 05/20/21 12:28 AM  Result Value Ref Range   Glucose-Capillary 107 (H) 70 - 99 mg/dL    Comment: Glucose reference range applies only to samples taken after fasting for at least 8 hours.  Glucose, capillary     Status: None   Collection Time: 05/20/21  4:04 AM  Result Value Ref Range   Glucose-Capillary 88 70 - 99 mg/dL    Comment: Glucose reference range applies only to samples taken after fasting for at least 8 hours.  Glucose, capillary     Status: Abnormal   Collection Time: 05/20/21  7:41 AM  Result Value Ref Range   Glucose-Capillary 129 (H) 70 - 99 mg/dL    Comment: Glucose reference range applies only to samples taken after fasting for at least 8 hours.    I have reviewed the  patient's current medications.  ASSESSMENT: Principal Problem:   Pyelonephritis affecting pregnancy Active Problems:   Insufficient prenatal care in third trimester   Malaria during pregnancy, third trimester   PLAN: HD #2  malaria in third trimester Pt is stable Pt is s/p artesunate x 3 Awaiting results of new peripheral smear Will defer treatment plan to ID team and appreciate their assistance HIV is negative, 24 hour urine protein is pending Due to low h/h will recheck cbc today Continue BID NST   Continue routine antenatal care.   Mariel Aloe, MD [facility] Faculty Attending, Center for [facility] 05/20/2021 9:56 AM

## 2021-05-20 NOTE — Progress Notes (Signed)
She has now received three doses of IV artesunate and can continue with CoArtem.  No parasitemia yet done but ok to transition to oral therapy.   Labs stable with stable Hgb and platelets.  Will continue to follow Gardiner Barefoot, MD

## 2021-05-21 DIAGNOSIS — B54 Unspecified malaria: Secondary | ICD-10-CM | POA: Diagnosis not present

## 2021-05-21 DIAGNOSIS — O368131 Decreased fetal movements, third trimester, fetus 1: Secondary | ICD-10-CM | POA: Diagnosis not present

## 2021-05-21 DIAGNOSIS — O98613 Protozoal diseases complicating pregnancy, third trimester: Secondary | ICD-10-CM | POA: Diagnosis not present

## 2021-05-21 DIAGNOSIS — Z3A34 34 weeks gestation of pregnancy: Secondary | ICD-10-CM | POA: Diagnosis not present

## 2021-05-21 LAB — GLUCOSE, CAPILLARY
Glucose-Capillary: 99 mg/dL (ref 70–99)
Glucose-Capillary: 78 mg/dL (ref 70–99)
Glucose-Capillary: 141 mg/dL — ABNORMAL HIGH (ref 70–99)
Glucose-Capillary: 76 mg/dL (ref 70–99)
Glucose-Capillary: 118 mg/dL — ABNORMAL HIGH (ref 70–99)

## 2021-05-21 NOTE — Progress Notes (Signed)
FACULTY PRACTICE ANTEPARTUM PROGRESS NOTE  Linda Caldwell is a 25 y.o. G2P0010 at [redacted]w[redacted]d who is admitted for malaria in pregnancy.  Estimated Date of Delivery: 06/24/21 Fetal presentation is cephalic.  Length of Stay:  3 Days. Admitted 05/18/2021  Subjective: Pt has completed 3 IV doses of artesunate and now is taking oral coArtem. Patient reports normal fetal movement.  She denies uterine contractions, denies bleeding and leaking of fluid per vagina.  Pt denies fever, chills or body/abdominal pain.  Vitals:  Blood pressure 111/72, pulse 72, temperature 98.8 F (37.1 C), temperature source Oral, resp. rate 16, height 5\' 3"  (1.6 m), weight 68.2 kg, last menstrual period 09/17/2020, SpO2 100 %. Physical Examination: CONSTITUTIONAL: Well-developed, well-nourished female in no acute distress.  HENT:  Normocephalic, atraumatic, External right and left ear normal. Oropharynx is clear and moist EYES: Conjunctivae and EOM are normal.  NECK: Normal range of motion, supple, no masses. SKIN: Skin is warm and dry. No rash noted. Not diaphoretic. No erythema. No pallor. NEUROLGIC: Alert and oriented to person, place, and time. Normal reflexes, muscle tone coordination. No cranial nerve deficit noted. PSYCHIATRIC: Normal mood and affect. Normal behavior. Normal judgment and thought content. CARDIOVASCULAR: Normal heart rate noted, regular rhythm RESPIRATORY: Effort and breath sounds normal, no problems with respiration noted MUSCULOSKELETAL: Normal range of motion. No edema and no tenderness. ABDOMEN: Soft, nontender, nondistended, gravid. CERVIX: deferred  Fetal monitoring: FHR: 120s bpm, Variability: marked, Accelerations: Present, Decelerations: Absent  Uterine activity: none   Results for orders placed or performed during the hospital encounter of 05/18/21 (from the past 48 hour(s))  Glucose, capillary     Status: Abnormal   Collection Time: 05/19/21  1:27 PM  Result Value Ref Range    Glucose-Capillary 126 (H) 70 - 99 mg/dL    Comment: Glucose reference range applies only to samples taken after fasting for at least 8 hours.  Protein, urine, 24 hour     Status: Abnormal   Collection Time: 05/19/21  2:00 PM  Result Value Ref Range   Urine Total Volume-UPROT 1,800 mL   Collection Interval-UPROT 24 hours   Protein, Urine 31 mg/dL   Protein, 14/02/22 Urine 02R (H) 50 - 100 mg/day    Comment: Performed at [facility], [address] [place], [place]  HIV Antibody (routine testing w rflx)     Status: None   Collection Time: 05/19/21  5:33 PM  Result Value Ref Range   HIV Screen 4th Generation wRfx Non Reactive Non Reactive    Comment: Performed at [facility], [address] [place], [place]  Glucose, capillary     Status: Abnormal   Collection Time: 05/19/21  7:52 PM  Result Value Ref Range   Glucose-Capillary 198 (H) 70 - 99 mg/dL    Comment: Glucose reference range applies only to samples taken after fasting for at least 8 hours.  Glucose, capillary     Status: Abnormal   Collection Time: 05/20/21 12:28 AM  Result Value Ref Range   Glucose-Capillary 107 (H) 70 - 99 mg/dL    Comment: Glucose reference range applies only to samples taken after fasting for at least 8 hours.  Glucose, capillary     Status: None   Collection Time: 05/20/21  4:04 AM  Result Value Ref Range   Glucose-Capillary 88 70 - 99 mg/dL    Comment: Glucose reference range applies only to samples taken after fasting for at least 8 hours.  Glucose,  capillary     Status: Abnormal   Collection Time: 05/20/21  7:41 AM  Result Value Ref Range   Glucose-Capillary 129 (H) 70 - 99 mg/dL    Comment: Glucose reference range applies only to samples taken after fasting for at least 8 hours.  CBC with Differential/Platelet     Status: Abnormal   Collection Time: 05/20/21 10:19 AM  Result Value Ref Range   WBC 5.3 4.0 - 10.5 K/uL   RBC 2.86 (L) 3.87 - 5.11 MIL/uL   Hemoglobin  8.6 (L) 12.0 - 15.0 g/dL   HCT 12.8 (L) 78.6 - 76.7 %   MCV 89.2 80.0 - 100.0 fL   MCH 30.1 26.0 - 34.0 pg   MCHC 33.7 30.0 - 36.0 g/dL   RDW 20.9 47.0 - 96.2 %   Platelets 42 (L) 150 - 400 K/uL    Comment: Immature Platelet Fraction may be clinically indicated, consider ordering this additional test [id number] CONSISTENT WITH PREVIOUS RESULT REPEATED TO VERIFY    nRBC 3.4 (H) 0.0 - 0.2 %   Neutrophils Relative % 50 %   Neutro Abs 2.7 1.7 - 7.7 K/uL   Lymphocytes Relative 38 %   Lymphs Abs 2.0 0.7 - 4.0 K/uL   Monocytes Relative 8 %   Monocytes Absolute 0.4 0.1 - 1.0 K/uL   Eosinophils Relative 1 %   Eosinophils Absolute 0.1 0.0 - 0.5 K/uL   Basophils Relative 1 %   Basophils Absolute 0.0 0.0 - 0.1 K/uL   WBC Morphology MORPHOLOGY UNREMARKABLE    Smear Review PLATELETS APPEAR DECREASED    Immature Granulocytes 2 %   Abs Immature Granulocytes 0.12 (H) 0.00 - 0.07 K/uL   Polychromasia PRESENT     Comment: Performed at [facility], [address], [place], [place] [address]  Glucose, capillary     Status: Abnormal   Collection Time: 05/20/21 12:29 PM  Result Value Ref Range   Glucose-Capillary 115 (H) 70 - 99 mg/dL    Comment: Glucose reference range applies only to samples taken after fasting for at least 8 hours.  Glucose, capillary     Status: None   Collection Time: 05/20/21  4:32 PM  Result Value Ref Range   Glucose-Capillary 86 70 - 99 mg/dL    Comment: Glucose reference range applies only to samples taken after fasting for at least 8 hours.  Glucose, capillary     Status: None   Collection Time: 05/20/21  8:12 PM  Result Value Ref Range   Glucose-Capillary 86 70 - 99 mg/dL    Comment: Glucose reference range applies only to samples taken after fasting for at least 8 hours.  Glucose, capillary     Status: Abnormal   Collection Time: 05/20/21 11:34 PM  Result Value Ref Range   Glucose-Capillary 135 (H) 70 - 99 mg/dL    Comment: Glucose reference range applies  only to samples taken after fasting for at least 8 hours.  Glucose, capillary     Status: None   Collection Time: 05/21/21  4:28 AM  Result Value Ref Range   Glucose-Capillary 99 70 - 99 mg/dL    Comment: Glucose reference range applies only to samples taken after fasting for at least 8 hours.  Glucose, capillary     Status: None   Collection Time: 05/21/21  8:19 AM  Result Value Ref Range   Glucose-Capillary 78 70 - 99 mg/dL    Comment: Glucose reference range applies only to samples taken after  fasting for at least 8 hours.   Comment 1 Notify RN    Comment 2 Document in Chart     I have reviewed the patient's current medications.  ASSESSMENT: Principal Problem:   Pyelonephritis affecting pregnancy Active Problems:   Insufficient prenatal care in third trimester   Malaria during pregnancy, third trimester   PLAN: HD3 malaria in third trimester Currently stable, feels well Peripheral smear per lab and hematology Continue oral anti-parasitic 24 hour urine slightly elevated; however, no signs/symptoms of preeclampsia Continue fetal testing Discharge per ID recommendations.    Continue routine antenatal care.   Mariel Aloe, MD [facility] Faculty Attending, [facility] 05/21/2021 10:33 AM

## 2021-05-22 DIAGNOSIS — O98613 Protozoal diseases complicating pregnancy, third trimester: Secondary | ICD-10-CM | POA: Diagnosis not present

## 2021-05-22 DIAGNOSIS — B54 Unspecified malaria: Secondary | ICD-10-CM | POA: Diagnosis not present

## 2021-05-22 DIAGNOSIS — O2303 Infections of kidney in pregnancy, third trimester: Secondary | ICD-10-CM | POA: Diagnosis not present

## 2021-05-22 LAB — PARASITE EXAM SCREEN, BLOOD-W CONF TO LABCORP (NOT @ ARMC)

## 2021-05-22 LAB — BPAM RBC
Blood Product Expiration Date: 202212232359
Blood Product Expiration Date: 202212242359
ISSUE DATE / TIME: 202212020755
Unit Type and Rh: 6200
Unit Type and Rh: 6200

## 2021-05-22 LAB — TYPE AND SCREEN
ABO/RH(D): A POS
Antibody Screen: NEGATIVE
Unit division: 0
Unit division: 0

## 2021-05-22 LAB — GLUCOSE, CAPILLARY
Glucose-Capillary: 114 mg/dL — ABNORMAL HIGH (ref 70–99)
Glucose-Capillary: 78 mg/dL (ref 70–99)
Glucose-Capillary: 80 mg/dL (ref 70–99)
Glucose-Capillary: 82 mg/dL (ref 70–99)

## 2021-05-22 LAB — CBC WITH DIFFERENTIAL/PLATELET
Abs Immature Granulocytes: 0.22 10*3/uL — ABNORMAL HIGH (ref 0.00–0.07)
Basophils Absolute: 0 10*3/uL (ref 0.0–0.1)
Basophils Relative: 0 %
Eosinophils Absolute: 0.1 10*3/uL (ref 0.0–0.5)
Eosinophils Relative: 1 %
HCT: 30.4 % — ABNORMAL LOW (ref 36.0–46.0)
Hemoglobin: 10.1 g/dL — ABNORMAL LOW (ref 12.0–15.0)
Immature Granulocytes: 3 %
Lymphocytes Relative: 32 %
Lymphs Abs: 2.5 10*3/uL (ref 0.7–4.0)
MCH: 29.6 pg (ref 26.0–34.0)
MCHC: 33.2 g/dL (ref 30.0–36.0)
MCV: 89.1 fL (ref 80.0–100.0)
Monocytes Absolute: 0.6 10*3/uL (ref 0.1–1.0)
Monocytes Relative: 8 %
Neutro Abs: 4.4 10*3/uL (ref 1.7–7.7)
Neutrophils Relative %: 56 %
Platelets: 112 10*3/uL — ABNORMAL LOW (ref 150–400)
RBC: 3.41 MIL/uL — ABNORMAL LOW (ref 3.87–5.11)
RDW: 14.8 % (ref 11.5–15.5)
WBC: 7.8 10*3/uL (ref 4.0–10.5)
nRBC: 0.5 % — ABNORMAL HIGH (ref 0.0–0.2)

## 2021-05-22 LAB — COMPREHENSIVE METABOLIC PANEL
ALT: 54 U/L — ABNORMAL HIGH (ref 0–44)
AST: 67 U/L — ABNORMAL HIGH (ref 15–41)
Albumin: 2.2 g/dL — ABNORMAL LOW (ref 3.5–5.0)
Alkaline Phosphatase: 153 U/L — ABNORMAL HIGH (ref 38–126)
Anion gap: 4 — ABNORMAL LOW (ref 5–15)
BUN: <5 mg/dL — ABNORMAL LOW (ref 6–20)
CO2: 23 mmol/L (ref 22–32)
Calcium: 8.7 mg/dL — ABNORMAL LOW (ref 8.9–10.3)
Chloride: 106 mmol/L (ref 98–111)
Creatinine, Ser: 0.56 mg/dL (ref 0.44–1.00)
GFR, Estimated: >60 mL/min (ref >60)
Glucose, Bld: 82 mg/dL (ref 70–99)
Potassium: 4.6 mmol/L (ref 3.5–5.1)
Sodium: 133 mmol/L — ABNORMAL LOW (ref 135–145)
Total Bilirubin: 0.1 mg/dL — ABNORMAL LOW (ref 0.3–1.2)
Total Protein: 6.3 g/dL — ABNORMAL LOW (ref 6.5–8.1)

## 2021-05-22 LAB — PATHOLOGIST SMEAR REVIEW

## 2021-05-22 NOTE — Discharge Summary (Signed)
Antenatal Physician Discharge Summary  Patient ID: Linda Caldwell MRN: [id number] DOB/AGE: [birth year] 25 y.o.  Admit date: 05/18/2021 Discharge date: 05/22/2021  Admission Diagnoses:Principal Problem:   Malaria during pregnancy, third trimester Active Problems:   Insufficient prenatal care in third trimester   Discharge Diagnoses: Principal Problem:   Malaria during pregnancy, third trimester Active Problems:   Insufficient prenatal care in third trimester   Prenatal Procedures: none  Consults: Infectious Disease, Maternal Fetal Medicine  Things to follow-up on: Weekly CBC (risk of hemolysis due to Artesunate) and CMP (LFT's were creeping up at DC) Infectious disease follow-up Weekly BPP's with MFM  [place] interpreter used via [employer] video  Hospital Course:  Linda Caldwell is a 25 y.o. G2P0010 with IUP at [redacted]w[redacted]d admitted for headache and hip pain.  She felt feverish.  She was admitted with presumed pyelonephritis.  She also had thrombocytopenia and a peripheral smear was revealed malaria.  No leaking of fluid and no bleeding.  Maternal-fetal medicine and infectious disease were consulted and the patient was started on artesunate for 3 days and then transition to Coartem.  Daily peripheral blood smears were obtained until they were negative.  Infectious disease signed off we will arrange for outpatient follow-up as the patient will need weekly CBCs and CMP's due to artesunate dosing.  She was deemed stable for discharge to home with outpatient follow up.  Discharge Exam: Temp:  [97.6 F (36.4 C)-98.6 F (37 C)] 98.2 F (36.8 C) (12/05 1121) Pulse Rate:  [63-79] 71 (12/05 1121) Resp:  [16-18] 18 (12/05 1121) BP: (86-112)/(46-71) 86/71 (12/05 1121) SpO2:  [99 %-100 %] 100 % (12/05 1121) Physical Examination: CONSTITUTIONAL: Well-developed, well-nourished female in no acute distress.  HENT:  Normocephalic, atraumatic, External right and left ear normal.  EYES:  Conjunctivae and EOM are normal. Pupils are equal, round, and reactive to light. No scleral icterus.  NECK: Normal range of motion, supple, no masses SKIN: Skin is warm and dry. No rash noted. Not diaphoretic. No erythema. No pallor. NEUROLOGIC: Alert and oriented to person, place, and time. Normal reflexes, muscle tone coordination. No cranial nerve deficit noted. PSYCHIATRIC: Normal mood and affect. Normal behavior. Normal judgment and thought content. CARDIOVASCULAR: Normal heart rate noted, regular rhythm RESPIRATORY: Effort and breath sounds normal, no problems with respiration noted MUSCULOSKELETAL: Normal range of motion. No edema and no tenderness. 2+ distal pulses. ABDOMEN: Soft, nontender, nondistended, gravid. CERVIX: Dilation: Closed Effacement (%): Thick Exam by:: Julianne Handler, CNM  Fetal monitoring: FHR: 120 bpm, Variability: moderate, Accelerations: Present, Decelerations: Absent  Uterine activity: quiet  Significant Diagnostic Studies:  Results for orders placed or performed during the hospital encounter of 05/18/21 (from the past 168 hour(s))  Resp Panel by RT-PCR (Flu A&B, Covid) Nasopharyngeal Swab   Collection Time: 05/18/21  4:17 PM   Specimen: Nasopharyngeal Swab; Nasopharyngeal(NP) swabs in vial transport medium  Result Value Ref Range   SARS Coronavirus 2 by RT PCR NEGATIVE NEGATIVE   Influenza A by PCR NEGATIVE NEGATIVE   Influenza B by PCR NEGATIVE NEGATIVE  CBC   Collection Time: 05/18/21  4:32 PM  Result Value Ref Range   WBC 4.8 4.0 - 10.5 K/uL   RBC 3.28 (L) 3.87 - 5.11 MIL/uL   Hemoglobin 9.7 (L) 12.0 - 15.0 g/dL   HCT 30.1 (L) 36.0 - 46.0 %   MCV 91.8 80.0 - 100.0 fL   MCH 29.6 26.0 - 34.0 pg   MCHC 32.2 30.0 - 36.0 g/dL   RDW 13.9 11.5 -  15.5 %   Platelets PLATELET CLUMPS NOTED ON SMEAR, UNABLE TO ESTIMATE 150 - 400 K/uL   nRBC 0.0 0.0 - 0.2 %  Comprehensive metabolic panel   Collection Time: 05/18/21  4:32 PM  Result Value Ref Range    Sodium 134 (L) 135 - 145 mmol/L   Potassium 3.2 (L) 3.5 - 5.1 mmol/L   Chloride 102 98 - 111 mmol/L   CO2 22 22 - 32 mmol/L   Glucose, Bld 92 70 - 99 mg/dL   BUN 7 6 - 20 mg/dL   Creatinine, Ser 6.83 0.44 - 1.00 mg/dL   Calcium 8.7 (L) 8.9 - 10.3 mg/dL   Total Protein 6.8 6.5 - 8.1 g/dL   Albumin 2.8 (L) 3.5 - 5.0 g/dL   AST 48 (H) 15 - 41 U/L   ALT 24 0 - 44 U/L   Alkaline Phosphatase 175 (H) 38 - 126 U/L   Total Bilirubin 1.8 (H) 0.3 - 1.2 mg/dL   GFR, Estimated >41 >96 mL/min   Anion gap 10 5 - 15  Urinalysis, Routine w reflex microscopic Urine, Clean Catch   Collection Time: 05/18/21  4:42 PM  Result Value Ref Range   Color, Urine YELLOW YELLOW   APPearance CLEAR CLEAR   Specific Gravity, Urine 1.010 1.005 - 1.030   pH 6.5 5.0 - 8.0   Glucose, UA NEGATIVE NEGATIVE mg/dL   Hgb urine dipstick LARGE (A) NEGATIVE   Bilirubin Urine SMALL (A) NEGATIVE   Ketones, ur NEGATIVE NEGATIVE mg/dL   Protein, ur 222 (A) NEGATIVE mg/dL   Nitrite NEGATIVE NEGATIVE   Leukocytes,Ua NEGATIVE NEGATIVE  Urinalysis, Microscopic (reflex)   Collection Time: 05/18/21  4:42 PM  Result Value Ref Range   RBC / HPF 0-5 0 - 5 RBC/hpf   WBC, UA 0-5 0 - 5 WBC/hpf   Bacteria, UA RARE (A) NONE SEEN   Squamous Epithelial / LPF 0-5 0 - 5  Culture, OB Urine   Collection Time: 05/18/21  5:45 PM   Specimen: Urine, Random  Result Value Ref Range   Specimen Description URINE, RANDOM    Special Requests NONE    Culture      NO GROWTH Performed at [facility], [address], [place], [place] [address]    Report Status 05/19/2021 FINAL   CBC   Collection Time: 05/18/21  8:00 PM  Result Value Ref Range   WBC 5.7 4.0 - 10.5 K/uL   RBC 2.73 (L) 3.87 - 5.11 MIL/uL   Hemoglobin 8.2 (L) 12.0 - 15.0 g/dL   HCT 21.1 (L) 94.1 - 74.0 %   MCV 89.4 80.0 - 100.0 fL   MCH 30.0 26.0 - 34.0 pg   MCHC 33.6 30.0 - 36.0 g/dL   RDW 81.4 48.1 - 85.6 %   Platelets 43 (L) 150 - 400 K/uL  DIC Panel ONCE - STAT    Collection Time: 05/18/21  8:27 PM  Result Value Ref Range   Prothrombin Time 13.7 11.4 - 15.2 seconds   INR 1.0 0.8 - 1.2   aPTT 29 24 - 36 seconds   Fibrinogen 594 (H) 210 - 475 mg/dL   D-Dimer, Quant 3.14 (H) 0.00 - 0.50 ug/mL-FEU   Platelets 49 (L) 150 - 400 K/uL   Smear Review NO SCHISTOCYTES SEEN   Pathologist smear review   Collection Time: 05/18/21  8:27 PM  Result Value Ref Range   Path Review Parasites present c/w plasmodium species.   Save Smear  Collection Time: 05/18/21  8:27 PM  Result Value Ref Range   Smear Review SMEAR STAINED AND AVAILABLE FOR REVIEW   CBC   Collection Time: 05/18/21  8:27 PM  Result Value Ref Range   WBC 4.2 4.0 - 10.5 K/uL   RBC 3.83 (L) 3.87 - 5.11 MIL/uL   Hemoglobin 11.4 (L) 12.0 - 15.0 g/dL   HCT 34.1 (L) 36.0 - 46.0 %   MCV 89.0 80.0 - 100.0 fL   MCH 29.8 26.0 - 34.0 pg   MCHC 33.4 30.0 - 36.0 g/dL   RDW 14.0 11.5 - 15.5 %   Platelets 35 (L) 150 - 400 K/uL   nRBC 0.0 0.0 - 0.2 %  Comprehensive metabolic panel   Collection Time: 05/18/21  8:27 PM  Result Value Ref Range   Sodium 133 (L) 135 - 145 mmol/L   Potassium 3.4 (L) 3.5 - 5.1 mmol/L   Chloride 105 98 - 111 mmol/L   CO2 19 (L) 22 - 32 mmol/L   Glucose, Bld 112 (H) 70 - 99 mg/dL   BUN 7 6 - 20 mg/dL   Creatinine, Ser 0.58 0.44 - 1.00 mg/dL   Calcium 8.3 (L) 8.9 - 10.3 mg/dL   Total Protein 5.9 (L) 6.5 - 8.1 g/dL   Albumin 2.4 (L) 3.5 - 5.0 g/dL   AST 40 15 - 41 U/L   ALT 20 0 - 44 U/L   Alkaline Phosphatase 142 (H) 38 - 126 U/L   Total Bilirubin 1.8 (H) 0.3 - 1.2 mg/dL   GFR, Estimated >60 >60 mL/min   Anion gap 9 5 - 15  Parasite Exam Screen, Blood-w conf to [employer] (Not @ [facility])   Collection Time: 05/18/21  8:27 PM  Result Value Ref Range   Parasite Exam Screen, Blood          Parasite Exam, Blood   Collection Time: 05/18/21  8:27 PM  Result Value Ref Range   Parasite Exam, Blood Positive (A) None Seen  Type and screen [place]   Collection  Time: 05/18/21  8:27 PM  Result Value Ref Range   ABO/RH(D) A POS    Antibody Screen NEG    Sample Expiration 05/21/2021,2359    Unit Number [id number]    Blood Component Type RED CELLS,LR    Unit division 00    Status of Unit ISSUED,FINAL    Transfusion Status OK TO TRANSFUSE    Crossmatch Result Compatible    Unit Number [id number]    Blood Component Type RED CELLS,LR    Unit division 00    Status of Unit REL FROM [facility]    Transfusion Status OK TO TRANSFUSE    Crossmatch Result      Compatible Performed at [facility], [address] [place], [place] [address]   Direct antiglobulin test (not at [facility])   Collection Time: 05/18/21  8:27 PM  Result Value Ref Range   DAT, complement NEG    DAT, IgG      NEG Performed at [place] [address] [place], [place] [address]   BPAM [facility]   Collection Time: 05/18/21  8:27 PM  Result Value Ref Range   ISSUE DATE / TIME [id number]    Blood Product Unit Number [id number]    PRODUCT CODE [id number]    Unit Type and Rh F4600501    Blood Product Expiration Date [id number]    Blood Product Unit Number [id number]    PRODUCT  CODE [id number]    Unit Type and Rh 6200    Blood Product Expiration Date 202212232359   Culture, blood (routine x 2)   Collection Time: 05/18/21  8:28 PM   Specimen: BLOOD  Result Value Ref Range   Specimen Description BLOOD RIGHT ANTECUBITAL    Special Requests      BOTTLES DRAWN AEROBIC AND ANAEROBIC Blood Culture adequate volume   Culture      NO GROWTH 4 DAYS Performed at [facility], [place] [address] [place], [place] [address]    Report Status PENDING   Culture, blood (routine x 2)   Collection Time: 05/18/21  8:28 PM   Specimen: BLOOD RIGHT HAND  Result Value Ref Range   Specimen Description BLOOD RIGHT HAND    Special Requests      BOTTLES DRAWN AEROBIC AND ANAEROBIC Blood Culture adequate volume   Culture      NO GROWTH 4 DAYS Performed at [place], [place] [address] [place], [place] [address]    Report Status PENDING   Prepare RBC (crossmatch)   Collection Time: 05/18/21 11:30 PM  Result Value Ref Range   Order Confirmation      ORDER PROCESSED BY BLOOD BANK Performed at [facility], [place] [address], [place], [place] [address]   Glucose, capillary   Collection Time: 05/19/21  4:27 AM  Result Value Ref Range   Glucose-Capillary 148 (H) 70 - 99 mg/dL  Comprehensive metabolic panel   Collection Time: 05/19/21  5:01 AM  Result Value Ref Range   Sodium 136 135 - 145 mmol/L   Potassium 4.0 3.5 - 5.1 mmol/L   Chloride 110 98 - 111 mmol/L   CO2 22 22 - 32 mmol/L   Glucose, Bld 149 (H) 70 - 99 mg/dL   BUN 8 6 - 20 mg/dL   Creatinine, Ser 0.47 0.44 - 1.00 mg/dL   Calcium 8.5 (L) 8.9 - 10.3 mg/dL   Total Protein 5.6 (L) 6.5 - 8.1 g/dL   Albumin 2.2 (L) 3.5 - 5.0 g/dL   AST 31 15 - 41 U/L   ALT 20 0 - 44 U/L   Alkaline Phosphatase 139 (H) 38 - 126 U/L   Total Bilirubin 0.5 0.3 - 1.2 mg/dL   GFR, Estimated >60 >60 mL/min   Anion gap 4 (L) 5 - 15  CBC with Differential   Collection Time: 05/19/21  5:01 AM  Result Value Ref Range   WBC 5.6 4.0 - 10.5 K/uL   RBC 2.62 (L) 3.87 - 5.11 MIL/uL   Hemoglobin 8.0 (L) 12.0 - 15.0 g/dL   HCT 23.7 (L) 36.0 - 46.0 %   MCV 90.5 80.0 - 100.0 fL   MCH 30.5 26.0 - 34.0 pg   MCHC 33.8 30.0 - 36.0 g/dL   RDW 14.3 11.5 - 15.5 %   Platelets 38 (L) 150 - 400 K/uL   Neutrophils Relative % 76 %   Neutro Abs 4.2 1.7 - 7.7 K/uL   Lymphocytes Relative 18 %   Lymphs Abs 1.0 0.7 - 4.0 K/uL   Monocytes Relative 5 %   Monocytes Absolute 0.3 0.1 - 1.0 K/uL   Eosinophils Relative 0 %   Eosinophils Absolute 0.0 0.0 - 0.5 K/uL   Basophils Relative 0 %   Basophils Absolute 0.0 0.0 - 0.1 K/uL   nRBC 0 0 /100 WBC   Immature Granulocytes 1 %   Abs Immature Granulocytes 0.07 0.00 - 0.07 K/uL  Parasite Exam Screen, Blood-w conf to [facility] (Not @ [facility])   Collection Time: 05/19/21  5:01 AM  Result Value  Ref Range   Parasite Exam Screen, Blood          Parasite Exam, Blood   Collection Time: 05/19/21  5:01 AM  Result Value Ref Range   Parasite Exam, Blood Positive (A) None Seen  Pathologist smear review   Collection Time: 05/19/21  5:01 AM  Result Value Ref Range   Path Review Parasites present.   Glucose, capillary   Collection Time: 05/19/21  8:42 AM  Result Value Ref Range   Glucose-Capillary 131 (H) 70 - 99 mg/dL  Glucose, capillary   Collection Time: 05/19/21  1:27 PM  Result Value Ref Range   Glucose-Capillary 126 (H) 70 - 99 mg/dL  Protein, urine, 24 hour   Collection Time: 05/19/21  2:00 PM  Result Value Ref Range   Urine Total Volume-UPROT 1,800 mL   Collection Interval-UPROT 24 hours   Protein, Urine 31 mg/dL   Protein, 16X24H Urine 096558 (H) 50 - 100 mg/day  HIV Antibody (routine testing w rflx)   Collection Time: 05/19/21  5:33 PM  Result Value Ref Range   HIV Screen 4th Generation wRfx Non Reactive Non Reactive  Glucose, capillary   Collection Time: 05/19/21  7:52 PM  Result Value Ref Range   Glucose-Capillary 198 (H) 70 - 99 mg/dL  Glucose, capillary   Collection Time: 05/20/21 12:28 AM  Result Value Ref Range   Glucose-Capillary 107 (H) 70 - 99 mg/dL  Glucose, capillary   Collection Time: 05/20/21  4:04 AM  Result Value Ref Range   Glucose-Capillary 88 70 - 99 mg/dL  Glucose, capillary   Collection Time: 05/20/21  7:41 AM  Result Value Ref Range   Glucose-Capillary 129 (H) 70 - 99 mg/dL  CBC with Differential/Platelet   Collection Time: 05/20/21 10:19 AM  Result Value Ref Range   WBC 5.3 4.0 - 10.5 K/uL   RBC 2.86 (L) 3.87 - 5.11 MIL/uL   Hemoglobin 8.6 (L) 12.0 - 15.0 g/dL   HCT 04.525.5 (L) 40.936.0 - 81.146.0 %   MCV 89.2 80.0 - 100.0 fL   MCH 30.1 26.0 - 34.0 pg   MCHC 33.7 30.0 - 36.0 g/dL   RDW 91.415.0 78.211.5 - 95.615.5 %   Platelets 42 (L) 150 - 400 K/uL   nRBC 3.4 (H) 0.0 - 0.2 %   Neutrophils Relative % 50 %   Neutro Abs 2.7 1.7 - 7.7 K/uL   Lymphocytes  Relative 38 %   Lymphs Abs 2.0 0.7 - 4.0 K/uL   Monocytes Relative 8 %   Monocytes Absolute 0.4 0.1 - 1.0 K/uL   Eosinophils Relative 1 %   Eosinophils Absolute 0.1 0.0 - 0.5 K/uL   Basophils Relative 1 %   Basophils Absolute 0.0 0.0 - 0.1 K/uL   WBC Morphology MORPHOLOGY UNREMARKABLE    Smear Review PLATELETS APPEAR DECREASED    Immature Granulocytes 2 %   Abs Immature Granulocytes 0.12 (H) 0.00 - 0.07 K/uL   Polychromasia PRESENT   Glucose, capillary   Collection Time: 05/20/21 12:29 PM  Result Value Ref Range   Glucose-Capillary 115 (H) 70 - 99 mg/dL  Glucose, capillary   Collection Time: 05/20/21  4:32 PM  Result Value Ref Range   Glucose-Capillary 86 70 - 99 mg/dL  Glucose, capillary   Collection Time: 05/20/21  8:12 PM  Result Value Ref Range   Glucose-Capillary 86 70 -  99 mg/dL  Glucose, capillary   Collection Time: 05/20/21 11:34 PM  Result Value Ref Range   Glucose-Capillary 135 (H) 70 - 99 mg/dL  Glucose, capillary   Collection Time: 05/21/21  4:28 AM  Result Value Ref Range   Glucose-Capillary 99 70 - 99 mg/dL  Glucose, capillary   Collection Time: 05/21/21  8:19 AM  Result Value Ref Range   Glucose-Capillary 78 70 - 99 mg/dL   Comment 1 Notify RN    Comment 2 Document in Chart   Glucose, capillary   Collection Time: 05/21/21 11:57 AM  Result Value Ref Range   Glucose-Capillary 141 (H) 70 - 99 mg/dL   Comment 1 Notify RN    Comment 2 Document in Chart   Glucose, capillary   Collection Time: 05/21/21  4:06 PM  Result Value Ref Range   Glucose-Capillary 76 70 - 99 mg/dL   Comment 1 Notify RN    Comment 2 Document in Chart   Glucose, capillary   Collection Time: 05/21/21  8:18 PM  Result Value Ref Range   Glucose-Capillary 118 (H) 70 - 99 mg/dL  Glucose, capillary   Collection Time: 05/22/21 12:15 AM  Result Value Ref Range   Glucose-Capillary 80 70 - 99 mg/dL  Glucose, capillary   Collection Time: 05/22/21  4:20 AM  Result Value Ref Range    Glucose-Capillary 82 70 - 99 mg/dL  Glucose, capillary   Collection Time: 05/22/21  8:26 AM  Result Value Ref Range   Glucose-Capillary 78 70 - 99 mg/dL  Parasite Exam Screen, Blood-w conf to [facility] (Not @ [facility])   Collection Time: 05/22/21  9:08 AM  Result Value Ref Range   Parasite Exam Screen, Blood          CBC with Differential/Platelet   Collection Time: 05/22/21  9:44 AM  Result Value Ref Range   WBC 7.8 4.0 - 10.5 K/uL   RBC 3.41 (L) 3.87 - 5.11 MIL/uL   Hemoglobin 10.1 (L) 12.0 - 15.0 g/dL   HCT 30.4 (L) 36.0 - 46.0 %   MCV 89.1 80.0 - 100.0 fL   MCH 29.6 26.0 - 34.0 pg   MCHC 33.2 30.0 - 36.0 g/dL   RDW 14.8 11.5 - 15.5 %   Platelets 112 (L) 150 - 400 K/uL   nRBC 0.5 (H) 0.0 - 0.2 %   Neutrophils Relative % 56 %   Neutro Abs 4.4 1.7 - 7.7 K/uL   Lymphocytes Relative 32 %   Lymphs Abs 2.5 0.7 - 4.0 K/uL   Monocytes Relative 8 %   Monocytes Absolute 0.6 0.1 - 1.0 K/uL   Eosinophils Relative 1 %   Eosinophils Absolute 0.1 0.0 - 0.5 K/uL   Basophils Relative 0 %   Basophils Absolute 0.0 0.0 - 0.1 K/uL   Immature Granulocytes 3 %   Abs Immature Granulocytes 0.22 (H) 0.00 - 0.07 K/uL  Comprehensive metabolic panel   Collection Time: 05/22/21  9:44 AM  Result Value Ref Range   Sodium 133 (L) 135 - 145 mmol/L   Potassium 4.6 3.5 - 5.1 mmol/L   Chloride 106 98 - 111 mmol/L   CO2 23 22 - 32 mmol/L   Glucose, Bld 82 70 - 99 mg/dL   BUN <5 (L) 6 - 20 mg/dL   Creatinine, Ser 0.56 0.44 - 1.00 mg/dL   Calcium 8.7 (L) 8.9 - 10.3 mg/dL   Total Protein 6.3 (L) 6.5 - 8.1 g/dL   Albumin 2.2 (L) 3.5 -  5.0 g/dL   AST 67 (H) 15 - 41 U/L   ALT 54 (H) 0 - 44 U/L   Alkaline Phosphatase 153 (H) 38 - 126 U/L   Total Bilirubin 0.1 (L) 0.3 - 1.2 mg/dL   GFR, Estimated >60 >60 mL/min   Anion gap 4 (L) 5 - 15  Glucose, capillary   Collection Time: 05/22/21  1:36 PM  Result Value Ref Range   Glucose-Capillary 114 (H) 70 - 99 mg/dL   US Renal  Result Date: 05/18/2021 CLINICAL  DATA:  Hematuria with fever pregnant patient EXAM: RENAL / URINARY TRACT ULTRASOUND COMPLETE COMPARISON:  None. FINDINGS: Right Kidney: Renal measurements: 11 x 5.3 x 4.4 cm = volume: 135.3 mL. Echogenicity within normal limits. No mass. Mild right hydronephrosis Left Kidney: Renal measurements: 12 x 5.3 x 4.6 cm = volume: 153 mL. Echogenicity within normal limits. No mass or hydronephrosis visualized. Bladder: Appears normal for degree of bladder distention. Other: None. IMPRESSION: Mild right hydronephrosis. Normal ultrasound appearance of left kidney Electronically Signed   By: Donavan Foil M.D.   On: 05/18/2021 21:31   [place] MFM FETAL BPP WO NON STRESS  Result Date: 05/19/2021 ----------------------------------------------------------------------  OBSTETRICS REPORT                       (Signed Final 05/19/2021 01:31 pm) ---------------------------------------------------------------------- Patient Info  ID #:       [id number]                          D.O.B.:  January 08, 1996 (25 yrs)  Name:       Lanna Poche               Visit Date: 05/18/2021 06:32 pm ---------------------------------------------------------------------- Performed By  Attending:        Johnell Comings MD         Secondary Phy.:   [place]  Performed By:     Hubert Azure          Address:          [address]                                                             [place]  [id number]  Referred By:      Clenton Pare              [facility]                    [place]  Ref. Address:     [address], [place]                    [place], [place]  ---------------------------------------------------------------------- Orders  #  Description                           Code        Ordered By  1  [place] MFM FETAL BPP WO NON               76819.01    MELANIE Mignon ----------------------------------------------------------------------  #  Order #                     Accession #                Episode #  1  [id number]                   [id number]                 [id number] ---------------------------------------------------------------------- Indications  Decreased fetal movement                       O36.8190  [redacted] weeks gestation of pregnancy                Z3A.34 ---------------------------------------------------------------------- Fetal Evaluation  Num Of Fetuses:         1  Fetal Heart Rate(bpm):  158  Cardiac Activity:       Observed  Presentation:           Cephalic  Placenta:               Anterior  P. Cord Insertion:      Visualized, central  Amniotic Fluid  AFI FV:      Within normal limits  AFI Sum(cm)     %Tile       Largest Pocket(cm)  12.8            41          5.4  RUQ(cm)       RLQ(cm)       LUQ(cm)        LLQ(cm)  1.9           3.6           5.4            1.9 ---------------------------------------------------------------------- Biophysical Evaluation  Amniotic F.V:   Within normal limits       F. Tone:        Observed  F. Movement:    Not Observed  Score:          6/8  F. Breathing:   Observed ---------------------------------------------------------------------- OB History  Gravidity:    2         Term:   0        Prem:   0        SAB:   1  TOP:          0       Ectopic:  0        Living: 0 ---------------------------------------------------------------------- Gestational Age  LMP:           34w 5d        Date:  09/17/20                 EDD:   06/24/21  Best:          34w 5d     Det. By:  LMP  (09/17/20)          EDD:   06/24/21 ---------------------------------------------------------------------- Comments  This patient has  been hospitalized due to fever and joint pain.  A biophysical profile performed today was 6 out of 8.  She  received a -2 for fetal breathing movements that did not meet  criteria.  Her NST was reactive.  There was normal amniotic fluid noted. ----------------------------------------------------------------------                   Johnell Comings, MD Electronically Signed Final Report   05/19/2021 01:31 pm ----------------------------------------------------------------------  [place] MFM OB COMP + 14 WK  Result Date: 05/19/2021 ----------------------------------------------------------------------  OBSTETRICS REPORT                        (Signed Final 05/19/2021 10:47 am) ---------------------------------------------------------------------- Patient Info  ID #:       [id number]                          D.O.B.:  [birth year] (25 yrs)  Name:       Lanna Poche               Visit Date: 05/19/2021 10:19 am ---------------------------------------------------------------------- Performed By  Attending:        Johnell Comings MD         Secondary Phy.:    [facility]  Performed By:     Hubert Azure          Address:           [place]                                                              [phone]  [place]                                                              [id number]  Referred By:      Clenton Pare              Location[facility]          [facility]                    [place]  Ref. Address:     [address] [place]                    [place], [place] ---------------------------------------------------------------------- Orders  #  Description                           Code        Ordered By  1  [place] MFM OB COMP + 14 WK                [id number]    YU FANG  ----------------------------------------------------------------------  #  Order #                     Accession #                Episode #  1  [id number]                   [id number]                 [id number] ---------------------------------------------------------------------- Indications  Medical complication of pregnancy               O26.90  (suspected malaria)  [redacted] weeks gestation of pregnancy                 Z3A.34 ---------------------------------------------------------------------- Fetal Evaluation  Num Of Fetuses:          1  Fetal Heart Rate(bpm):   137  Cardiac Activity:        Observed  Presentation:            Cephalic  Placenta:                Anterior  P. Cord Insertion:       Visualized  Amniotic Fluid  AFI FV:      Within normal limits  AFI Sum(cm)     %Tile       Largest Pocket(cm)  9.1             14          3.5  RUQ(cm)       RLQ(cm)       LUQ(cm)        LLQ(cm)  3.5  2.3           1.4            1.9 ---------------------------------------------------------------------- Biometry  BPD:      86.6  mm     G. Age:  35w 0d         54  %    CI:        79.49   %    70 - 86                                                          FL/HC:       20.7  %    20.1 - 22.3  HC:        307  mm     G. Age:  34w 2d          8  %    HC/AC:       0.97       0.93 - 1.11  AC:      317.1  mm     G. Age:  35w 4d         78  %    FL/BPD:      73.6  %    71 - 87  FL:       63.7  mm     G. Age:  32w 6d          6  %    FL/AC:       20.1  %    20 - 24  HUM:      56.2  mm     G. Age:  32w 5d         20  %  LV:        7.2  mm  Est. FW:    2493   gm     5 lb 8 oz     41  % ---------------------------------------------------------------------- OB History  Gravidity:    2         Term:   0        Prem:   0        SAB:   1  TOP:          0       Ectopic:  0        Living: 0 ---------------------------------------------------------------------- Gestational Age  LMP:           34w 6d        Date:  09/17/20                  EDD:   06/24/21  U/S Today:     34w 3d                                        EDD:   06/27/21  Best:          34w 6d     Det. By:  LMP  (09/17/20)          EDD:   06/24/21 ---------------------------------------------------------------------- Anatomy  Cranium:               Appears normal  LVOT:                   Appears normal  Cavum:                 Appears normal         Aortic Arch:            Appears normal  Ventricles:            Appears normal         Diaphragm:              Appears normal  Choroid Plexus:        Appears normal         Stomach:                Appears normal, left                                                                        sided  Cerebellum:            Appears normal         Abdomen:                Appears normal  Thoracic:              Appears normal         Cord Vessels:           Appears normal (3                                                                        vessel cord)  Heart:                 Appears normal         Kidneys:                Appear normal                         (4CH, axis, and                         situs)  RVOT:                  Appears normal         Bladder:                Appears normal ---------------------------------------------------------------------- Comments  Tanmayi Pendley is a 25 year old gravida 2 para 0 currently  at 66 and 6 weeks with an EDC of June 24, 2021.  She  presented to the hospital yesterday as she was not feeling  well along with complaints of headache, myalgias, and hip  pain.  She also had a low-grade fever.  The patient has a  significant travel history where has been in [place] (a country in  [place]) for the past 2 months.  She returned to  the  [place] about 2 days ago.  She reports that she did have mosquito bites in [place]. She  was not taking any medications for malarial prophylaxis.  She  reports that she had a negative test for malaria in [place] last  month.  On admission, she had a blood  smear/parasite screen that  was positive for Plasmodium.  The screen has been sent to a  reference lab for identification and speciation.  Due to the diagnosis of malaria, she was started on oral  Coartem for treatment last night.  On admission, the patient was noted to be severely anemic  with an H/H of 8 and 23.7.  Her platelet count is also low  varying from 43,000-38,000.  Her serum creatinine level and  liver function tests are within normal limits.  Due to severe  anemia, she received a blood transfusion this morning.  Her fetal status has been reassuring.  She had a growth ultrasound performed today showing an  EFW of 5 pounds 8 ounces (41st percentile).  There was normal amniotic fluid with a total AFI of 9.1 cm.  The fetus is in the vertex presentation.  A normal-appearing anterior placenta is noted.  Her past OB history includes a first trimester miscarriage.  She denies any significant past medical or surgical history.  The following were discussed with the patient today:  Malaria in pregnancy  Malaria is caused by the parasite Plasmodium.  The 4  species of Plasmodium that cause human malaria are vivax,  ovale, malarie, and falciparum.  Malaria is the most common human parasitic disease.  Clinical findings include fevers, chills, and flulike symptoms  including headaches, myalgia, and malaise.  It may also be  associated with anemia and jaundice.  Malaria infection in pregnancy is often confused with HELLP  syndrome, as low platelets and anemia are often noted in  both conditions.  Anemia associated with malaria is primarily caused by  splenic sequestration of the infected erythrocytes and  sequestration of erythrocytes in the placenta.  Infections with the Plasmodium Falciparum species is more  commonly associated with severe morbidity and mortality and  have been associated with kidney failure, coma, and death.  Identification of parasites by microscopic evaluation of the  blood smear is considered the  gold standard for diagnosis.  Infected erythrocytes can accumulate in the vascular areas of  the placenta.  High levels of placental parasitemia correlate with increased  risk of stillbirth, preterm delivery, and fetal growth restriction.  According to the CDC, the recommended treatment for  pregnant patients with uncomplicated malaria is Coartem  (Artemether-lumefantrine) in the second and third trimesters.  In discussion with the infectious disease team, due to the  large amount of parasites in her blood, they will switch the  patient from oral Coartem to IV artesunate.  They will continue to follow her blood smears every 24 hours  to monitor the parasite density and her response to treatment.  It is anticipated that she will require at least 3 doses of IV  artesunate for treatment.  The infectious disease team will  switch her to oral therapy once she completes her IV therapy.  As the patient's blood pressures have been within normal  limits, I highly doubt that her low platelet count and anemia is  due to HELLP syndrome.  However, she should have a 24-  hour urine collected to ensure that she does not have  significant proteinuria.  As pregnant women who have coexisting HIV  are at highest  risk for morbidity and mortality secondary to a malarial  infection, she should have a repeat HIV test drawn.  Once she has completed the treatment for her malaria  infection and is discharged home, we will continue to follow  her with weekly fetal testing.  Delivery may be considered at 39 weeks should her fetal  status remains reassuring.  The patient stated that all of her questions have been  answered today.  All conversations were held with the patient today with the  help of a [place] interpreter.  Recommendations:  Continue treatment for malaria as recommended by  infectious disease  24-hour urine collection  HIV test  Weekly BPP's once she is discharged  Delivery at 39 weeks  ----------------------------------------------------------------------                   Johnell Comings, MD Electronically Signed Final Report   05/19/2021 10:47 am ----------------------------------------------------------------------   No future appointments.  Discharge Condition: Stable  Discharge disposition: 01-Home or Self Care       Discharge Instructions     Discharge activity:  No Restrictions   Complete by: As directed    Discharge diet:  No restrictions   Complete by: As directed    Fetal Kick Count:  Lie on our left side for one hour after a meal, and count the number of times your baby kicks.  If it is less than 5 times, get up, move around and drink some juice.  Repeat the test 30 minutes later.  If it is still less than 5 kicks in an hour, notify your doctor.   Complete by: As directed    No sexual activity restrictions   Complete by: As directed    Notify physician for a general feeling that "something is not right"   Complete by: As directed    Notify physician for increase or change in vaginal discharge   Complete by: As directed    Notify physician for intestinal cramps, with or without diarrhea, sometimes described as "gas pain"   Complete by: As directed    Notify physician for leaking of fluid   Complete by: As directed    Notify physician for low, dull backache, unrelieved by heat or Tylenol   Complete by: As directed    Notify physician for menstrual like cramps   Complete by: As directed    Notify physician for pelvic pressure   Complete by: As directed    Notify physician for uterine contractions.  These may be painless and feel like the uterus is tightening or the baby is  "balling up"   Complete by: As directed    Notify physician for vaginal bleeding   Complete by: As directed    PRETERM LABOR:  Includes any of the follwing symptoms that occur between 20 - [redacted] weeks gestation.  If these symptoms are not stopped, preterm labor can result in preterm  delivery, placing your baby at risk   Complete by: As directed       Allergies as of 05/22/2021   No Known Allergies      Medication List     STOP taking these medications    metroNIDAZOLE 500 MG tablet Commonly known as: FLAGYL   nitrofurantoin (macrocrystal-monohydrate) 100 MG capsule Commonly known as: MACROBID   terconazole 0.4 % vaginal cream Commonly known as: TERAZOL 7       TAKE these medications    acetaminophen 500 MG tablet Commonly known as: TYLENOL  Take 500 mg by mouth every 6 (six) hours as needed for headache, fever or moderate pain.   famotidine 20 MG tablet Commonly known as: Pepcid Take 1 tablet (20 mg total) by mouth 2 (two) times daily.   Vitafol Gummies 3.33-0.333-34.8 MG Chew Chew 3 tablets by mouth daily before breakfast.        Follow-up Information     [facility] Follow up in 3 day(s).   Specialty: Infectious Diseases Why: Hospital follow-up Contact information: [place], [place] [id number] [facility].   Contact information: [address] [place] [phone]                Total discharge time: 45 minutes   Signed: Donnamae Jude M.D. 05/22/2021, 2:22 PM

## 2021-05-22 NOTE — Progress Notes (Addendum)
[facility]  Date of Admission:  05/18/2021   Total days of inpatient antibiotics 5  Principal Problem:   Pyelonephritis affecting pregnancy Active Problems:   Insufficient prenatal care in third trimester   Malaria during pregnancy, third trimester          Assessment: 25 YO french speaking G2P0010 F at 110w6d admitted for presumed pyelonephritis based on clinical presentation which turned out to be malarial infecton. Found to have plasmodium on smear and started on coartem, ID consulted for further management.    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 Received artesunate x 3 doses. Now on day 3/3 Coartem.  Recommendations:  -Pt is clinically improving. No complaints. -Counseled that she will complete her last dose of caortem today. Parasite smear (Exam screen)ordered for today to monitor clearance. Follow-up in ID clinic this week following discharge.  -Since pt was given artesunate will need weekly cbc/cmp x 4 weeks to evaluate for hypersensitivity and hemolytic anemia -Obtain parasite smear in clinic if not cleared form today's test.  -Monitor cbc/smp inpatient -ID will sign off   Microbiology:   Antibiotics: Coartem 12/1, 12/3-12/5 Artesunate 12/2-12/3   Cultures: Blood 12/1 NGTD 12/1 Parasite blood smear P falciparum 4% 12/2 6.6%    SUBJECTIVE: Resting in bed. No new complaints. No significant overnight events.   Review of Systems: Review of Systems  All other systems reviewed and are negative.   Scheduled Meds:  sodium chloride   Intravenous Once   artemether-lumefantrine  4 tablet Oral Q12H   influenza vac split quadrivalent PF  0.5 mL Intramuscular Tomorrow-1000   prenatal multivitamin  1 tablet Oral Q1200   Continuous Infusions:  dextrose 5 %  lactated ringers with kcl 125 mL/hr at 05/22/21 1000   PRN Meds:.acetaminophen, calcium carbonate, docusate sodium, ondansetron (ZOFRAN) IV No Known Allergies  OBJECTIVE: Vitals:   05/21/21 1935 05/22/21 0014 05/22/21 0419 05/22/21 0808  BP: (!) 107/46 (!) 103/48 (!) 102/54 112/60  Pulse: 79 65 63 66  Resp: 16 16 16 16   Temp: 97.6 F (36.4 C) 98.5 F (36.9 C) 98.2 F (36.8 C) 98.6 F (37 C)  TempSrc: Oral Oral Oral Oral  SpO2: 99% 99% 100% 99%  Weight:      Height:       Body mass index is 26.63 kg/m.  Physical Exam Constitutional:      Appearance: Normal appearance.  HENT:     Head: Normocephalic and atraumatic.     Right Ear: Tympanic membrane normal.     Left Ear: Tympanic membrane normal.     Nose: Nose normal.     Mouth/Throat:     Mouth: Mucous membranes are moist.  Eyes:     Extraocular Movements: Extraocular movements intact.     Conjunctiva/sclera: Conjunctivae normal.     Pupils: Pupils are equal, round, and reactive to light.  Cardiovascular:     Rate and Rhythm: Normal rate and regular rhythm.     Heart sounds: No murmur heard.   No friction rub. No gallop.  Pulmonary:     Effort: Pulmonary effort is normal.     Breath sounds: Normal breath sounds.  Abdominal:     General: Abdomen is flat.     Palpations: Abdomen is soft.     Comments: Gravid  Musculoskeletal:        General: Normal range of motion.  Skin:    General: Skin is warm and dry.  Neurological:     General: No focal deficit present.     Mental Status: She is alert and oriented to person, place, and time.  Psychiatric:        Mood and Affect: Mood normal.      Lab Results Lab Results  Component Value Date   WBC 7.8 05/22/2021   HGB 10.1 (L) 05/22/2021   HCT 30.4 (L) 05/22/2021   MCV 89.1 05/22/2021   PLT 112 (L) 05/22/2021    Lab Results  Component Value Date   CREATININE 0.56 05/22/2021   BUN <5 (L) 05/22/2021   NA 133 (L) 05/22/2021   K 4.6 05/22/2021   CL 106  05/22/2021   CO2 23 05/22/2021    Lab Results  Component Value Date   ALT 54 (H) 05/22/2021   AST 67 (H) 05/22/2021   ALKPHOS 153 (H) 05/22/2021   BILITOT 0.1 (L) 05/22/2021        Danelle Earthly, MD [facility] 05/22/2021, 10:57 AM

## 2021-05-23 LAB — CULTURE, BLOOD (ROUTINE X 2)
Culture: NO GROWTH
Culture: NO GROWTH
Special Requests: ADEQUATE
Special Requests: ADEQUATE

## 2021-05-24 LAB — PATHOLOGIST SMEAR REVIEW: Path Review: NONE SEEN

## 2021-05-25 ENCOUNTER — Other Ambulatory Visit: Payer: Self-pay

## 2021-05-25 ENCOUNTER — Ambulatory Visit (INDEPENDENT_AMBULATORY_CARE_PROVIDER_SITE_OTHER): Payer: Medicaid Other | Admitting: Internal Medicine

## 2021-05-25 VITALS — BP 105/67 | HR 87 | Temp 98.4°F | Wt 148.0 lb

## 2021-05-25 DIAGNOSIS — B54 Unspecified malaria: Secondary | ICD-10-CM | POA: Diagnosis not present

## 2021-05-25 LAB — PARASITE EXAM, BLOOD

## 2021-05-25 NOTE — Progress Notes (Signed)
[facility]  Patient Active Problem List   Diagnosis Date Noted   Malaria during pregnancy, third trimester 05/19/2021   Insufficient prenatal care in third trimester 05/18/2021   Language barrier 12/08/2020   Supervision of normal pregnancy, antepartum 11/23/2020      Subjective:    Patient ID: Linda Caldwell, female    DOB: 1995/11/11, 25 y.o.   MRN: [id number]  No chief complaint on file.   HPI:  Linda Caldwell is a 25 y.o. female 3rd trimester pregnancy here for f/u hospital admission for severe p falciparum infection  History/interview via video language interpreter (french)   She was in the [place] for only months prior to returning to [place] and contracting malaria. Didn't take malaria prophy due to pregnancy  She was last seen 12/05 by my partner in the hospital: Coartem 12/01 and then finished 3 day pack on 12/05; in between had 3 days of artesunate for elevated parasitemia of 6.6% on 12/02  Patient's blood smear showed clearance of parasite on discharge  No primaquine given due to pregnancy and no obvious vivax/ovale infection  She is feeling well today. No contraction/vaginal bleeding. She is expected to give birth 1/07. She doesn't have any appointment with her obstetrician scheduled (dr Lauralyn Primes Ma Rings)  No Known Allergies    Outpatient Medications Prior to Visit  Medication Sig Dispense Refill   acetaminophen (TYLENOL) 500 MG tablet Take 500 mg by mouth every 6 (six) hours as needed for headache, fever or moderate pain.     famotidine (PEPCID) 20 MG tablet Take 1 tablet (20 mg total) by mouth 2 (two) times daily. 60 tablet 5   Prenatal Vit-Fe Phos-FA-Omega (VITAFOL GUMMIES) 3.33-0.333-34.8 MG CHEW Chew 3 tablets by mouth daily before breakfast. 90 tablet 11   No facility-administered medications prior to visit.     Social History   Socioeconomic History   Marital status: Married    Spouse name: Not on file    Number of children: 0   Years of education: Not on file   Highest education level: Not on file  Occupational History   Not on file  Tobacco Use   Smoking status: Never   Smokeless tobacco: Never  Vaping Use   Vaping Use: Never used  Substance and Sexual Activity   Alcohol use: Not Currently   Drug use: Never   Sexual activity: Yes  Other Topics Concern   Not on file  Social History Narrative   Not on file   Social Determinants of Health   Financial Resource Strain: Not on file  Food Insecurity: Not on file  Transportation Needs: Not on file  Physical Activity: Not on file  Stress: Not on file  Social Connections: Not on file  Intimate Partner Violence: Not on file      Review of Systems    All other ros negative  Objective:    LMP 09/17/2020  Nursing note and vital signs reviewed.  Physical Exam     General/constitutional: no distress, pleasant HEENT: Normocephalic, PER, Conj Clear, EOMI, Oropharynx clear Neck supple CV: rrr no mrg Lungs: clear to auscultation, normal respiratory effort Abd: Soft, Nontender -- gravid Ext: no edema Skin: No Rash Neuro: nonfocal MSK: no peripheral joint swelling/tenderness/warmth; back spines nontender   Labs: CBC    Component Value Date/Time   WBC 7.8 05/22/2021 0944   RBC 3.41 (L) 05/22/2021 0944   HGB 10.1 (L) 05/22/2021 0944   HGB  12.4 11/24/2020 1421   HCT 30.4 (L) 05/22/2021 0944   HCT 37.2 11/24/2020 1421   PLT 112 (L) 05/22/2021 0944   PLT 245 11/24/2020 1421   MCV 89.1 05/22/2021 0944   MCV 94 11/24/2020 1421   MCH 29.6 05/22/2021 0944   MCHC 33.2 05/22/2021 0944   RDW 14.8 05/22/2021 0944   RDW 12.9 11/24/2020 1421   LYMPHSABS 2.5 05/22/2021 0944   LYMPHSABS 1.7 11/24/2020 1421   MONOABS 0.6 05/22/2021 0944   EOSABS 0.1 05/22/2021 0944   EOSABS 0.1 11/24/2020 1421   BASOSABS 0.0 05/22/2021 0944   BASOSABS 0.0 11/24/2020 1421   Last metabolic panel Lab Results  Component Value Date    GLUCOSE 82 05/22/2021   NA 133 (L) 05/22/2021   K 4.6 05/22/2021   CL 106 05/22/2021   CO2 23 05/22/2021   BUN <5 (L) 05/22/2021   CREATININE 0.56 05/22/2021   GFRNONAA >60 05/22/2021   CALCIUM 8.7 (L) 05/22/2021   PROT 6.3 (L) 05/22/2021   ALBUMIN 2.2 (L) 05/22/2021   BILITOT 0.1 (L) 05/22/2021   ALKPHOS 153 (H) 05/22/2021   AST 67 (H) 05/22/2021   ALT 54 (H) 05/22/2021   ANIONGAP 4 (L) 05/22/2021   Micro:  Serology:  Imaging:  Assessment & Plan:   Problem List Items Addressed This Visit   None Visit Diagnoses     Malaria    -  Primary   Relevant Orders   CBC   COMPLETE METABOLIC PANEL WITH GFR   Lactate dehydrogenase   Malaria Smear       25 yo immigrant from [place], [place]  (French) in her 3rd trimester dx'ed with high parasitemia of malaria s/p 3 days artesunate transitioned to coartem also of 3 days, here for f/u for monitoring for delayed hemolytic reaction due to artesunate. She didn't receive primaquine course due to pregnancy and vivax/ovale not common in [place].   No orders of the defined types were placed in this encounter.  Will repeat cbc, cmp in another 7 days and 14 days (standing orders placed)  Follow up in 10 days  Chart sent to obstetrician to see if need for f/u prior to expected delivery  Follow-up: Return in about 10 days (around 06/04/2021).      Raymondo Band, MD [facility] [phone]  pager   [phone] cell 05/25/2021, 2:43 PM

## 2021-05-25 NOTE — Patient Instructions (Signed)
Please repeat blood tests on 12/15 and 12/22  Please see me again in 10 days. This is to make sure that you don't have any delayed adverse reaction to the malaria medication  I will also send this chart to your obstetrician to see if he needs to see you prior to delivery

## 2021-05-26 ENCOUNTER — Telehealth: Payer: Self-pay

## 2021-05-26 NOTE — Telephone Encounter (Signed)
pac int-Linda Caldwell#[id number] for patient (to advise of MFM appt 12/15).

## 2021-05-26 NOTE — Progress Notes (Signed)
Thank you. I dont think she knew that.

## 2021-05-29 LAB — COMPLETE METABOLIC PANEL WITH GFR
AG Ratio: 0.9 (calc) — ABNORMAL LOW (ref 1.0–2.5)
ALT: 31 U/L — ABNORMAL HIGH (ref 6–29)
AST: 22 U/L (ref 10–30)
Albumin: 3.3 g/dL — ABNORMAL LOW (ref 3.6–5.1)
Alkaline phosphatase (APISO): 153 U/L — ABNORMAL HIGH (ref 31–125)
BUN: 8 mg/dL (ref 7–25)
CO2: 24 mmol/L (ref 20–32)
Calcium: 8.4 mg/dL — ABNORMAL LOW (ref 8.6–10.2)
Chloride: 104 mmol/L (ref 98–110)
Creat: 0.59 mg/dL (ref 0.50–0.96)
Globulin: 3.8 g/dL (calc) — ABNORMAL HIGH (ref 1.9–3.7)
Glucose, Bld: 92 mg/dL (ref 65–99)
Potassium: 4.1 mmol/L (ref 3.5–5.3)
Sodium: 136 mmol/L (ref 135–146)
Total Bilirubin: 0.4 mg/dL (ref 0.2–1.2)
Total Protein: 7.1 g/dL (ref 6.1–8.1)
eGFR: 130 mL/min/{1.73_m2} (ref >=60)

## 2021-05-29 LAB — CBC
HCT: 30.6 % — ABNORMAL LOW (ref 35.0–45.0)
Hemoglobin: 9.9 g/dL — ABNORMAL LOW (ref 11.7–15.5)
MCH: 28.9 pg (ref 27.0–33.0)
MCHC: 32.4 g/dL (ref 32.0–36.0)
MCV: 89.5 fL (ref 80.0–100.0)
MPV: 11.1 fL (ref 7.5–12.5)
Platelets: 256 10*3/uL (ref 140–400)
RBC: 3.42 10*6/uL — ABNORMAL LOW (ref 3.80–5.10)
RDW: 13.5 % (ref 11.0–15.0)
WBC: 8.8 10*3/uL (ref 3.8–10.8)

## 2021-05-29 LAB — MALARIA SMEAR

## 2021-05-30 ENCOUNTER — Telehealth: Payer: Self-pay | Admitting: Obstetrics and Gynecology

## 2021-05-30 ENCOUNTER — Encounter: Payer: Medicaid Other | Admitting: Obstetrics and Gynecology

## 2021-05-30 NOTE — Telephone Encounter (Signed)
Telephone call to patient to reschedule her missed ob appointment using [facility] Interpreter # [phone].  Call went to voicemail.  Left message for patient to call office to reschedule.

## 2021-06-01 ENCOUNTER — Ambulatory Visit: Payer: Medicaid Other

## 2021-06-06 ENCOUNTER — Other Ambulatory Visit: Payer: Self-pay

## 2021-06-06 ENCOUNTER — Encounter: Payer: Self-pay | Admitting: Infectious Diseases

## 2021-06-06 ENCOUNTER — Ambulatory Visit (INDEPENDENT_AMBULATORY_CARE_PROVIDER_SITE_OTHER): Payer: Medicaid Other | Admitting: Infectious Diseases

## 2021-06-06 VITALS — BP 90/66 | HR 82 | Temp 97.6°F | Resp 16 | Ht 63.0 in | Wt 153.2 lb

## 2021-06-06 DIAGNOSIS — Z3A00 Weeks of gestation of pregnancy not specified: Secondary | ICD-10-CM | POA: Diagnosis not present

## 2021-06-06 DIAGNOSIS — Z79899 Other long term (current) drug therapy: Secondary | ICD-10-CM

## 2021-06-06 DIAGNOSIS — B54 Unspecified malaria: Secondary | ICD-10-CM

## 2021-06-06 DIAGNOSIS — Z789 Other specified health status: Secondary | ICD-10-CM

## 2021-06-06 DIAGNOSIS — O98813 Other maternal infectious and parasitic diseases complicating pregnancy, third trimester: Secondary | ICD-10-CM

## 2021-06-06 DIAGNOSIS — Z3490 Encounter for supervision of normal pregnancy, unspecified, unspecified trimester: Secondary | ICD-10-CM

## 2021-06-06 HISTORY — DX: Encounter for supervision of normal pregnancy, unspecified, unspecified trimester: Z34.90

## 2021-06-06 HISTORY — DX: Other long term (current) drug therapy: Z79.899

## 2021-06-06 HISTORY — DX: Unspecified malaria: B54

## 2021-06-06 LAB — COMPREHENSIVE METABOLIC PANEL
AG Ratio: 1.1 (calc) (ref 1.0–2.5)
ALT: 11 U/L (ref 6–29)
AST: 16 U/L (ref 10–30)
Albumin: 3.8 g/dL (ref 3.6–5.1)
Alkaline phosphatase (APISO): 144 U/L — ABNORMAL HIGH (ref 31–125)
BUN/Creatinine Ratio: 11 (calc) (ref 6–22)
BUN: 6 mg/dL — ABNORMAL LOW (ref 7–25)
CO2: 25 mmol/L (ref 20–32)
Calcium: 9.6 mg/dL (ref 8.6–10.2)
Chloride: 101 mmol/L (ref 98–110)
Creat: 0.55 mg/dL (ref 0.50–0.96)
Globulin: 3.6 g/dL (calc) (ref 1.9–3.7)
Glucose, Bld: 68 mg/dL (ref 65–99)
Potassium: 4.5 mmol/L (ref 3.5–5.3)
Sodium: 135 mmol/L (ref 135–146)
Total Bilirubin: 0.4 mg/dL (ref 0.2–1.2)
Total Protein: 7.4 g/dL (ref 6.1–8.1)

## 2021-06-06 LAB — CBC
HCT: 32.9 % — ABNORMAL LOW (ref 35.0–45.0)
Hemoglobin: 11 g/dL — ABNORMAL LOW (ref 11.7–15.5)
MCH: 30.3 pg (ref 27.0–33.0)
MCHC: 33.4 g/dL (ref 32.0–36.0)
MCV: 90.6 fL (ref 80.0–100.0)
MPV: 11.2 fL (ref 7.5–12.5)
Platelets: 302 10*3/uL (ref 140–400)
RBC: 3.63 10*6/uL — ABNORMAL LOW (ref 3.80–5.10)
RDW: 14.9 % (ref 11.0–15.0)
WBC: 8.7 10*3/uL (ref 3.8–10.8)

## 2021-06-06 NOTE — Progress Notes (Signed)
Patient Active Problem List   Diagnosis Date Noted   Malaria during pregnancy, third trimester 05/19/2021   Insufficient prenatal care in third trimester 05/18/2021   Language barrier 12/08/2020   Supervision of normal pregnancy, antepartum 11/23/2020   Current Outpatient Medications on File Prior to Visit  Medication Sig Dispense Refill   acetaminophen (TYLENOL) 500 MG tablet Take 500 mg by mouth every 6 (six) hours as needed for headache, fever or moderate pain.     Prenatal Vit-Fe Phos-FA-Omega (VITAFOL GUMMIES) 3.33-0.333-34.8 MG CHEW Chew 3 tablets by mouth daily before breakfast. 90 tablet 11   No current facility-administered medications on file prior to visit.    Subjective: Here for follow up for malaria in the setting of third trimester pregnancy. She received IV artesunate * 3 doses followed by PO Coartem for 3 days. Parasite exam 12/5 and 12/8 no parasites seen. She was discharged on 12/5 and had a HFU with Dr Renold Don on 12/8 for monitoring delayed hemolytic reaction in the setting of IV artesunate use. CBC and CMP with no significant abnormality for hemolysis.   Spoke with patient with the help of in person interpreter. Husband also present. She was in [place] in Sept -Dec 2022, did not get malaria ppx prior to travel Had malaria long time before. Expected due date for delivery is jan 6. She has not followed up with OB after her recent hospitalization for malaria. She has good fetal movements. Denies any fevers, chills, sweats. Denies nausea, vomiting, rashes or diarrhea. Denies cough, chest pain and SOB. Appetite is good. She is doing well and has no complains today   Review of Systems: ROS 10 point ROS done with pertinent positives and negatives listed above   Past Medical History:  Diagnosis Date   Medical history non-contributory     Social History   Tobacco Use   Smoking status: Never   Smokeless tobacco: Never  Vaping Use   Vaping Use: Never used   Substance Use Topics   Alcohol use: Not Currently   Drug use: Never    Family History  Problem Relation Age of Onset   Hyperlipidemia Maternal Aunt     No Known Allergies  Health Maintenance  Topic Date Due   HPV VACCINES (1 - 2-dose series) Never done   TETANUS/TDAP  Never done   PAP-Cervical Cytology Screening  11/25/2023   PAP SMEAR-Modifier  11/25/2023   INFLUENZA VACCINE  Completed   Hepatitis C Screening  Completed   HIV Screening  Completed   Pneumococcal Vaccine 26-13 Years old  Aged Out    Objective: BP 90/66    Pulse 82    Temp 97.6 F (36.4 C) (Oral)    Resp 16    Ht 5\' 3"  (1.6 m)    Wt 153 lb 3.2 oz (69.5 kg)    LMP 09/17/2020    SpO2 100%    BMI 27.14 kg/m    Physical Exam Constitutional:      Appearance: Normal appearance.  HENT:     Head: Normocephalic and atraumatic.      Mouth: Mucous membranes are moist.  Eyes:    Conjunctiva/sclera: Conjunctivae normal.     Pupils: Pupils are equal, round, and reactive to light.   Cardiovascular:     Rate and Rhythm: Normal rate and regular rhythm.     Heart sounds: No murmur  Pulmonary:     Effort: Pulmonary effort is normal.  Breath sounds: Normal breath sounds.   Abdominal:     General: Abdomen is gravid     Palpations:   Musculoskeletal:        General: Normal range of motion.   Skin:    General: Skin is warm and dry.     Comments:  Neurological:     General: No focal deficit present.     Mental Status: She is alert and oriented to person, place, and time.   Psychiatric:        Mood and Affect: Mood normal.   Lab Results Lab Results  Component Value Date   WBC 8.8 05/25/2021   HGB 9.9 (L) 05/25/2021   HCT 30.6 (L) 05/25/2021   MCV 89.5 05/25/2021   PLT 256 05/25/2021    Lab Results  Component Value Date   CREATININE 0.59 05/25/2021   BUN 8 05/25/2021   NA 136 05/25/2021   K 4.1 05/25/2021   CL 104 05/25/2021   CO2 24 05/25/2021    Lab Results  Component Value Date   ALT  31 (H) 05/25/2021   AST 22 05/25/2021   ALKPHOS 153 (H) 05/22/2021   BILITOT 0.4 05/25/2021    No results found for: CHOL, HDL, LDLCALC, LDLDIRECT, TRIG, CHOLHDL Lab Results  Component Value Date   LABRPR Non Reactive 11/24/2020   No results found for: HIV1RNAQUANT, HIV1RNAVL, CD4TABS  Problem List Items Addressed This Visit       Other   Language barrier   Malaria - Primary   Relevant Orders   CBC   Comprehensive metabolic panel   High risk medication use   Assessment/Plan 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 As such, follow up  in 10-14 days for repeat CBC for monitoring delayed hemolytic reaction in the setting of IV artesunate use.   Fu with OB for natal care and delivery planning   I spent more than 40 minutes for this patient encounter including reviewing data/chart, and coordinating care and >50% direct face to face time providing counseling/discussing diagnostics/treatment plan with patient   Victoriano Lain, MD [facility] 06/06/2021, 2:36 PM

## 2021-06-16 ENCOUNTER — Other Ambulatory Visit (HOSPITAL_COMMUNITY)
Admission: RE | Admit: 2021-06-16 | Discharge: 2021-06-16 | Disposition: A | Discharge status: Home / Self Care | Payer: Medicaid Other | Source: Ambulatory Visit | Attending: Obstetrics & Gynecology | Admitting: Obstetrics & Gynecology

## 2021-06-16 ENCOUNTER — Ambulatory Visit (INDEPENDENT_AMBULATORY_CARE_PROVIDER_SITE_OTHER): Payer: Medicaid Other | Admitting: Obstetrics & Gynecology

## 2021-06-16 ENCOUNTER — Other Ambulatory Visit: Payer: Self-pay

## 2021-06-16 VITALS — BP 114/73 | HR 77 | Wt 153.0 lb

## 2021-06-16 DIAGNOSIS — Z3490 Encounter for supervision of normal pregnancy, unspecified, unspecified trimester: Secondary | ICD-10-CM | POA: Insufficient documentation

## 2021-06-16 DIAGNOSIS — B54 Unspecified malaria: Secondary | ICD-10-CM

## 2021-06-16 DIAGNOSIS — O0933 Supervision of pregnancy with insufficient antenatal care, third trimester: Secondary | ICD-10-CM | POA: Diagnosis not present

## 2021-06-16 DIAGNOSIS — Z789 Other specified health status: Secondary | ICD-10-CM | POA: Diagnosis not present

## 2021-06-16 DIAGNOSIS — O98613 Protozoal diseases complicating pregnancy, third trimester: Secondary | ICD-10-CM | POA: Diagnosis not present

## 2021-06-16 DIAGNOSIS — Z23 Encounter for immunization: Secondary | ICD-10-CM

## 2021-06-16 DIAGNOSIS — Z3A38 38 weeks gestation of pregnancy: Secondary | ICD-10-CM

## 2021-06-16 NOTE — Progress Notes (Signed)
° °  PRENATAL VISIT NOTE  Subjective:  Linda Caldwell is a 25 y.o. G2P0010 at [redacted]w[redacted]d being seen today for ongoing prenatal care.  She is currently monitored for the following issues for this high-risk pregnancy and has Supervision of normal pregnancy, antepartum; Language barrier; Insufficient prenatal care in third trimester; Malaria during pregnancy, third trimester; Malaria; High risk medication use; and Pregnancy on their problem list.  Patient reports occasional contractions.  Contractions: Irregular. Vag. Bleeding: None.  Movement: Present. Denies leaking of fluid.   The following portions of the patient's history were reviewed and updated as appropriate: allergies, current medications, past family history, past medical history, past social history, past surgical history and problem list.   Objective:   Vitals:   06/16/21 0944  BP: 114/73  Pulse: 77  Weight: 153 lb (69.4 kg)    Fetal Status: Fetal Heart Rate (bpm): 130   Movement: Present  Presentation: Vertex  General:  Alert, oriented and cooperative. Patient is in no acute distress.  Skin: Skin is warm and dry. No rash noted.   Cardiovascular: Normal heart rate noted  Respiratory: Normal respiratory effort, no problems with respiration noted  Abdomen: Soft, gravid, appropriate for gestational age.  Pain/Pressure: Absent     Pelvic: Cervical exam performed in the presence of a chaperone Dilation: 1 Effacement (%): 70 Station: Ballotable  Extremities: Normal range of motion.  Edema: None  Mental Status: Normal mood and affect. Normal behavior. Normal judgment and thought content.   Pregnancy: G2P0010 at [redacted]w[redacted]d 1. Encounter for supervision of normal pregnancy, antepartum, unspecified gravidity  - Tdap vaccine greater than or equal to 7yo IM - Culture, beta strep (group b only) - Cervicovaginal ancillary only( [place])  2. Insufficient prenatal care in third trimester Care done in [place]  3. Language barrier [name]  interpreter  4. Malaria during pregnancy, third trimester had care in [place]  Term labor symptoms and general obstetric precautions including but not limited to vaginal bleeding, contractions, leaking of fluid and fetal movement were reviewed in detail with the patient. Please refer to After Visit Summary for other counseling recommendations.   Return in about 1 week (around 06/23/2021).  Future Appointments  Date Time Provider Department Center  06/23/2021  1:45 PM Danelle Earthly, MD [facility]    Scheryl Darter, MD

## 2021-06-16 NOTE — Progress Notes (Signed)
ROB 38.6 wks Care gap. Went to [place]. Recent inpatient for malaria treatment.  Reports had flu shot. TDAP offered and accepted

## 2021-06-17 ENCOUNTER — Inpatient Hospital Stay (HOSPITAL_COMMUNITY)
Admission: AD | Admit: 2021-06-17 | Discharge: 2021-06-19 | DRG: 807 | Disposition: A | Discharge status: Home / Self Care | Payer: Medicaid Other | Attending: Obstetrics & Gynecology | Admitting: Obstetrics & Gynecology

## 2021-06-17 ENCOUNTER — Encounter (HOSPITAL_COMMUNITY): Payer: Self-pay | Admitting: Obstetrics and Gynecology

## 2021-06-17 ENCOUNTER — Other Ambulatory Visit: Payer: Self-pay

## 2021-06-17 ENCOUNTER — Inpatient Hospital Stay (HOSPITAL_COMMUNITY): Payer: Medicaid Other | Admitting: Anesthesiology

## 2021-06-17 DIAGNOSIS — O26893 Other specified pregnancy related conditions, third trimester: Principal | ICD-10-CM | POA: Diagnosis present

## 2021-06-17 DIAGNOSIS — Z8613 Personal history of malaria: Secondary | ICD-10-CM

## 2021-06-17 DIAGNOSIS — O0933 Supervision of pregnancy with insufficient antenatal care, third trimester: Secondary | ICD-10-CM

## 2021-06-17 DIAGNOSIS — Z789 Other specified health status: Secondary | ICD-10-CM | POA: Diagnosis present

## 2021-06-17 DIAGNOSIS — Z3A39 39 weeks gestation of pregnancy: Secondary | ICD-10-CM | POA: Diagnosis not present

## 2021-06-17 DIAGNOSIS — O98613 Protozoal diseases complicating pregnancy, third trimester: Secondary | ICD-10-CM | POA: Diagnosis present

## 2021-06-17 DIAGNOSIS — O864 Pyrexia of unknown origin following delivery: Secondary | ICD-10-CM | POA: Diagnosis not present

## 2021-06-17 DIAGNOSIS — Z79899 Other long term (current) drug therapy: Secondary | ICD-10-CM

## 2021-06-17 DIAGNOSIS — B54 Unspecified malaria: Secondary | ICD-10-CM | POA: Diagnosis present

## 2021-06-17 DIAGNOSIS — Z603 Acculturation difficulty: Secondary | ICD-10-CM | POA: Diagnosis present

## 2021-06-17 DIAGNOSIS — Z419 Encounter for procedure for purposes other than remedying health state, unspecified: Secondary | ICD-10-CM | POA: Diagnosis not present

## 2021-06-17 DIAGNOSIS — Z20822 Contact with and (suspected) exposure to covid-19: Secondary | ICD-10-CM | POA: Diagnosis present

## 2021-06-17 LAB — COMPREHENSIVE METABOLIC PANEL
ALT: 14 U/L (ref 0–44)
AST: 22 U/L (ref 15–41)
Albumin: 3.2 g/dL — ABNORMAL LOW (ref 3.5–5.0)
Alkaline Phosphatase: 159 U/L — ABNORMAL HIGH (ref 38–126)
Anion gap: 10 (ref 5–15)
BUN: 8 mg/dL (ref 6–20)
CO2: 19 mmol/L — ABNORMAL LOW (ref 22–32)
Calcium: 9.3 mg/dL (ref 8.9–10.3)
Chloride: 104 mmol/L (ref 98–111)
Creatinine, Ser: 0.62 mg/dL (ref 0.44–1.00)
GFR, Estimated: >60 mL/min (ref >60)
Glucose, Bld: 106 mg/dL — ABNORMAL HIGH (ref 70–99)
Potassium: 3.7 mmol/L (ref 3.5–5.1)
Sodium: 133 mmol/L — ABNORMAL LOW (ref 135–145)
Total Bilirubin: 0.5 mg/dL (ref 0.3–1.2)
Total Protein: 7.6 g/dL (ref 6.5–8.1)

## 2021-06-17 LAB — RESP PANEL BY RT-PCR (FLU A&B, COVID) ARPGX2
Influenza A by PCR: NEGATIVE
Influenza B by PCR: NEGATIVE
SARS Coronavirus 2 by RT PCR: NEGATIVE

## 2021-06-17 LAB — TYPE AND SCREEN
ABO/RH(D): A POS
Antibody Screen: NEGATIVE

## 2021-06-17 LAB — PROTEIN / CREATININE RATIO, URINE
Creatinine, Urine: 212.24 mg/dL
Protein Creatinine Ratio: 0.23 mg/mg{Cre} — ABNORMAL HIGH (ref 0.00–0.15)
Total Protein, Urine: 49 mg/dL

## 2021-06-17 LAB — CBC
HCT: 34.6 % — ABNORMAL LOW (ref 36.0–46.0)
Hemoglobin: 11.8 g/dL — ABNORMAL LOW (ref 12.0–15.0)
MCH: 31.1 pg (ref 26.0–34.0)
MCHC: 34.1 g/dL (ref 30.0–36.0)
MCV: 91.1 fL (ref 80.0–100.0)
Platelets: 200 10*3/uL (ref 150–400)
RBC: 3.8 MIL/uL — ABNORMAL LOW (ref 3.87–5.11)
RDW: 16.1 % — ABNORMAL HIGH (ref 11.5–15.5)
WBC: 9.3 10*3/uL (ref 4.0–10.5)
nRBC: 0 % (ref 0.0–0.2)

## 2021-06-17 LAB — RPR: RPR Ser Ql: NONREACTIVE

## 2021-06-17 MED ORDER — FENTANYL-BUPIVACAINE-NACL 0.5-0.125-0.9 MG/250ML-% EP SOLN
12.0000 mL/h | EPIDURAL | Status: DC | PRN
  Administered 2021-06-17: 12 mL/h via EPIDURAL
  Filled 2021-06-17: qty 250

## 2021-06-17 MED ORDER — LACTATED RINGERS IV SOLN: 500.0000 mL | Freq: Once | INTRAVENOUS | Status: DC

## 2021-06-17 MED ORDER — LACTATED RINGERS IV SOLN: INTRAVENOUS | Status: DC

## 2021-06-17 MED ORDER — FENTANYL CITRATE (PF) 100 MCG/2ML IJ SOLN
INTRAMUSCULAR | Status: AC
  Filled 2021-06-17: qty 2

## 2021-06-17 MED ORDER — EPHEDRINE 5 MG/ML INJ: 10.0000 mg | INTRAVENOUS | Status: DC | PRN

## 2021-06-17 MED ORDER — SOD CITRATE-CITRIC ACID 500-334 MG/5ML PO SOLN: 30.0000 mL | ORAL | Status: DC | PRN

## 2021-06-17 MED ORDER — ONDANSETRON HCL 4 MG/2ML IJ SOLN: 4.0000 mg | Freq: Four times a day (QID) | INTRAMUSCULAR | Status: DC | PRN

## 2021-06-17 MED ORDER — DIPHENHYDRAMINE HCL 25 MG PO CAPS: 25.0000 mg | ORAL_CAPSULE | Freq: Four times a day (QID) | ORAL | Status: DC | PRN

## 2021-06-17 MED ORDER — LIDOCAINE HCL (PF) 1 % IJ SOLN: 30.0000 mL | INTRAMUSCULAR | Status: DC | PRN

## 2021-06-17 MED ORDER — FENTANYL CITRATE (PF) 100 MCG/2ML IJ SOLN
100.0000 ug | Freq: Once | INTRAMUSCULAR | Status: AC
  Administered 2021-06-17: 100 ug via INTRAVENOUS
  Filled 2021-06-17: qty 2

## 2021-06-17 MED ORDER — ONDANSETRON HCL 4 MG PO TABS: 4.0000 mg | ORAL_TABLET | ORAL | Status: DC | PRN

## 2021-06-17 MED ORDER — DOCUSATE SODIUM 100 MG PO CAPS
100.0000 mg | ORAL_CAPSULE | Freq: Two times a day (BID) | ORAL | Status: DC
  Administered 2021-06-17 – 2021-06-19 (×3): 100 mg via ORAL
  Filled 2021-06-17 (×4): qty 1

## 2021-06-17 MED ORDER — DIPHENHYDRAMINE HCL 50 MG/ML IJ SOLN: 12.5000 mg | INTRAMUSCULAR | Status: DC | PRN

## 2021-06-17 MED ORDER — OXYCODONE HCL 5 MG PO TABS: 10.0000 mg | ORAL_TABLET | ORAL | Status: DC | PRN

## 2021-06-17 MED ORDER — ACETAMINOPHEN 325 MG PO TABS
650.0000 mg | ORAL_TABLET | ORAL | Status: DC | PRN
  Administered 2021-06-18 (×2): 650 mg via ORAL
  Filled 2021-06-17 (×3): qty 2

## 2021-06-17 MED ORDER — PHENYLEPHRINE 40 MCG/ML (10ML) SYRINGE FOR IV PUSH (FOR BLOOD PRESSURE SUPPORT): 80.0000 ug | PREFILLED_SYRINGE | INTRAVENOUS | Status: DC | PRN

## 2021-06-17 MED ORDER — FENTANYL CITRATE (PF) 100 MCG/2ML IJ SOLN
100.0000 ug | Freq: Once | INTRAMUSCULAR | Status: AC
  Administered 2021-06-17: 100 ug via INTRAVENOUS

## 2021-06-17 MED ORDER — BENZOCAINE-MENTHOL 20-0.5 % EX AERO
1.0000 "application " | INHALATION_SPRAY | CUTANEOUS | Status: DC | PRN
  Administered 2021-06-17: 1 via TOPICAL
  Filled 2021-06-17 (×2): qty 56

## 2021-06-17 MED ORDER — IBUPROFEN 600 MG PO TABS
600.0000 mg | ORAL_TABLET | Freq: Four times a day (QID) | ORAL | Status: DC
  Administered 2021-06-17 – 2021-06-19 (×6): 600 mg via ORAL
  Filled 2021-06-17 (×7): qty 1

## 2021-06-17 MED ORDER — OXYCODONE HCL 5 MG PO TABS: 5.0000 mg | ORAL_TABLET | ORAL | Status: DC | PRN

## 2021-06-17 MED ORDER — LACTATED RINGERS IV SOLN
500.0000 mL | INTRAVENOUS | Status: DC | PRN
Start: 2021-06-17 — End: 2021-06-17

## 2021-06-17 MED ORDER — ONDANSETRON HCL 4 MG/2ML IJ SOLN: 4.0000 mg | INTRAMUSCULAR | Status: DC | PRN

## 2021-06-17 MED ORDER — OXYTOCIN-SODIUM CHLORIDE 30-0.9 UT/500ML-% IV SOLN
2.5000 [IU]/h | INTRAVENOUS | Status: DC
  Filled 2021-06-17: qty 500

## 2021-06-17 MED ORDER — WITCH HAZEL-GLYCERIN EX PADS: 1.0000 "application " | MEDICATED_PAD | CUTANEOUS | Status: DC | PRN

## 2021-06-17 MED ORDER — FENTANYL-BUPIVACAINE-NACL 0.5-0.125-0.9 MG/250ML-% EP SOLN: 12.0000 mL/h | EPIDURAL | Status: DC | PRN

## 2021-06-17 MED ORDER — OXYTOCIN BOLUS FROM INFUSION
333.0000 mL | Freq: Once | INTRAVENOUS | Status: AC
  Administered 2021-06-17: 333 mL via INTRAVENOUS

## 2021-06-17 MED ORDER — PRENATAL MULTIVITAMIN CH
1.0000 | ORAL_TABLET | Freq: Every day | ORAL | Status: DC
  Administered 2021-06-18 – 2021-06-19 (×2): 1 via ORAL
  Filled 2021-06-17 (×2): qty 1

## 2021-06-17 MED ORDER — DIBUCAINE (PERIANAL) 1 % EX OINT: 1.0000 "application " | TOPICAL_OINTMENT | CUTANEOUS | Status: DC | PRN

## 2021-06-17 MED ORDER — LIDOCAINE-EPINEPHRINE (PF) 2 %-1:200000 IJ SOLN
INTRAMUSCULAR | Status: DC | PRN
  Administered 2021-06-17: 5 mL via EPIDURAL

## 2021-06-17 MED ORDER — ACETAMINOPHEN 325 MG PO TABS: 650.0000 mg | ORAL_TABLET | ORAL | Status: DC | PRN

## 2021-06-17 MED ORDER — SIMETHICONE 80 MG PO CHEW
80.0000 mg | CHEWABLE_TABLET | ORAL | Status: DC | PRN
  Filled 2021-06-17: qty 1

## 2021-06-17 MED ORDER — COCONUT OIL OIL: 1.0000 "application " | TOPICAL_OIL | Status: DC | PRN

## 2021-06-17 NOTE — Lactation Note (Signed)
This note was copied from a baby's chart. Lactation Consultation Note Will go back after baby is warmed up.  Patient Name: Linda Caldwell [id number] Date: 06/17/2021 Reason for consult: L&D Initial assessment Age:23 hours  Maternal Data    Feeding Mother's Current Feeding Choice: Breast Milk  LATCH Score Latch: Repeated attempts needed to sustain latch, nipple held in mouth throughout feeding, stimulation needed to elicit sucking reflex.  Audible Swallowing: A few with stimulation  Type of Nipple: Everted at rest and after stimulation  Comfort (Breast/Nipple): Soft / non-tender  Hold (Positioning): Assistance needed to correctly position infant at breast and maintain latch.  LATCH Score: 7   Lactation Tools Discussed/Used    Interventions Interventions: Skin to skin;Breast compression;Adjust position;Support pillows;Position options;Education  Discharge    Consult Status Consult Status: Follow-up from L&D    Alliana Mcauliff, Diamond Nickel 06/17/2021, 10:00 PM

## 2021-06-17 NOTE — Plan of Care (Signed)
Problem: Education: Goal: Knowledge of condition will improve Outcome: Completed/Met   Problem: Activity: Goal: Will verbalize the importance of balancing activity with adequate rest periods Outcome: Completed/Met   

## 2021-06-17 NOTE — Lactation Note (Signed)
This note was copied from a baby's chart. Lactation Consultation Note  Patient Name: Linda Caldwell [id number] Date: 06/17/2021 Reason for consult: L&D Initial assessment Age:23 hours LC entered the room, mom was doing skin to skin with infant. [name] interpreter used: [phone] Marylene Land Mom latched infant on her left breast using the cross cradle hold, infant was off and on breast and was still breastfeeding after 13 minutes when LC left the room. Mom knows to breastfeed infant according to primal cues, 8 to 12+ or more times within 24 hours, skin to skin. Mom knows to call RN/LC for further latch assistance on MBU if needed. LC congratulated parents on the birth of their daughter .  Maternal Data    Feeding Mother's Current Feeding Choice: Breast Milk  LATCH Score Latch: Repeated attempts needed to sustain latch, nipple held in mouth throughout feeding, stimulation needed to elicit sucking reflex.  Audible Swallowing: A few with stimulation  Type of Nipple: Everted at rest and after stimulation  Comfort (Breast/Nipple): Soft / non-tender  Hold (Positioning): Assistance needed to correctly position infant at breast and maintain latch.  LATCH Score: 7   Lactation Tools Discussed/Used    Interventions Interventions: Skin to skin;Breast compression;Adjust position;Support pillows;Position options;Education  Discharge    Consult Status Consult Status: Follow-up from L&D    Danelle Earthly 06/17/2021, 7:12 PM

## 2021-06-17 NOTE — MAU Note (Signed)
PT SAYS WITH INTERPRETER AND HUSBAND - UC STRONG SINCE 0300

## 2021-06-17 NOTE — H&P (Signed)
LABOR AND DELIVERY ADMISSION HISTORY AND PHYSICAL NOTE  Linda Caldwell is a 25 y.o. female G2P0010 with IUP at [redacted]w[redacted]d by LMP presenting for SOL.  Patient made change from 3.5cm to 5cm during her labor evaluation in MAU.  She reports positive fetal movement. She denies leakage of fluid, vaginal bleeding She plans on bottle feeding. Her contraception plan is: unsure.  Prenatal History/Complications: PNC in [place], period of interrupted care then care with [facility]:  @[redacted]w[redacted]d , CWD, normal anatomy, vertex presentation, anterior placenta, 41%ile, EFW 2493g  Pregnancy complications:  - Malaria requiring inpatient care 05/18/2021-05/22/2021  Past Medical History: Past Medical History:  Diagnosis Date   Medical history non-contributory     Past Surgical History: Past Surgical History:  Procedure Laterality Date   NO PAST SURGERIES      Obstetrical History: OB History     Gravida  2   Para      Term      Preterm      AB  1   Living  0      SAB  1   IAB      Ectopic      Multiple  1   Live Births              Social History: Social History   Socioeconomic History   Marital status: Married    Spouse name: Not on file   Number of children: 0   Years of education: Not on file   Highest education level: Not on file  Occupational History   Not on file  Tobacco Use   Smoking status: Never   Smokeless tobacco: Never  Vaping Use   Vaping Use: Never used  Substance and Sexual Activity   Alcohol use: Not Currently   Drug use: Never   Sexual activity: Yes  Other Topics Concern   Not on file  Social History Narrative   Not on file   Social Determinants of Health   Financial Resource Strain: Not on file  Food Insecurity: Not on file  Transportation Needs: Not on file  Physical Activity: Not on file  Stress: Not on file  Social Connections: Not on file    Family History: Family History  Problem Relation Age of Onset   Hyperlipidemia  Maternal Aunt     Allergies: No Known Allergies  Medications Prior to Admission  Medication Sig Dispense Refill Last Dose   acetaminophen (TYLENOL) 500 MG tablet Take 500 mg by mouth every 6 (six) hours as needed for headache, fever or moderate pain.      Prenatal Vit-Fe Phos-FA-Omega (VITAFOL GUMMIES) 3.33-0.333-34.8 MG CHEW Chew 3 tablets by mouth daily before breakfast. (Patient not taking: Reported on 06/16/2021) 90 tablet 11      Review of Systems  All systems reviewed and negative except as stated in HPI  Physical Exam Blood pressure 123/83, pulse 80, resp. rate (!) 22, weight 74.8 kg, last menstrual period 09/17/2020, SpO2 100 %. General appearance: alert, oriented, NAD Lungs: normal respiratory effort Heart: regular rate Abdomen: soft, non-tender; gravid, leopolds  Extremities: No calf swelling or tenderness Presentation: cephalic by suture per RN exam  Fetal monitoringBaseline: 120 bpm, Variability: Good {> 6 bpm), Accelerations: Reactive, and Decelerations: Absent Uterine activity: irregular q 2-4 min  Dilation: 5 Effacement (%): 90 Station: -1 Exam by:: [email or web address] RN  Prenatal labs: ABO, Rh: --/--/A POS (12/01 2027) Antibody: NEG (12/01 2027) Rubella: 23.80 (06/09 1421) RPR: Non Reactive (06/09 1421)  HBsAg: Negative (  06/09 1421)  HIV: Non Reactive (12/02 1733)  GC/Chlamydia: Collected 06/16/2021  GBS: Unknown (collected 06/16/2021   2-hr GTT: Not completed Genetic screening:  Declined by patient  Anatomy US: WNL  Prenatal Transfer Tool  Maternal Diabetes: No Genetic Screening: Declined Maternal Ultrasounds/Referrals: Normal Fetal Ultrasounds or other Referrals:  None Maternal Substance Abuse:  No Significant Maternal Medications:  Tx for malaria in third trimester Significant Maternal Lab Results: GBS unknown  No results found for this or any previous visit (from the past 24 hour(s)).  Patient Active Problem List   Diagnosis Date Noted   Malaria  06/06/2021   High risk medication use 06/06/2021   Pregnancy 06/06/2021   Malaria during pregnancy, third trimester 05/19/2021   Insufficient prenatal care in third trimester 05/18/2021   Language barrier 12/08/2020   Supervision of normal pregnancy, antepartum 11/23/2020    Assessment: Linda Caldwell is a 25 y.o. G2P0010 at [redacted]w[redacted]d here for SOL  #Labor: Expectant management #Pain: IV pain meds PRN, epidural upon request #FWB: Cat I #GBS/ID: Unknown, collected in office 12/30 #COVID: swab pending #MOF: Bottle #MOC: Undecided #Circ: N/A   Calvert Cantor, CNM 06/17/2021, 7:32 AM

## 2021-06-17 NOTE — Progress Notes (Signed)
A french interpreter from [facility] Number [phone] was available earlier in the evening but after 2100 was unavailable. The Peds Physician attempted to use but per doctor stratus stated there would be no one available this evening since it was a holiday.

## 2021-06-17 NOTE — Progress Notes (Signed)
Labor Progress Note Linda Caldwell is a 25 y.o. G2P0010 at [redacted]w[redacted]d presented for SOL  S: s/p epidural  O:  BP 128/72    Pulse 69    Temp 98 F (36.7 C) (Oral)    Resp 18    Wt 74.8 kg    LMP 09/17/2020    SpO2 100%    BMI 29.23 kg/m  EFM: 125/mod/+accels, no decels  CVE: 8.5/100/0 AROM performed, copious clear fluid   A&P: 25 y.o. G2P0010 [redacted]w[redacted]d SOL #Labor: Progressing well. AROM performed. Anticipate vaginal delivery #Pain: s/p epidural #FWB: Cat 1 #GBS negative  Federico Flake, MD 4:15 PM

## 2021-06-17 NOTE — Anesthesia Procedure Notes (Signed)
Epidural Patient location during procedure: OB Start time: 06/17/2021 12:37 PM End time: 06/17/2021 12:44 PM  Staffing Anesthesiologist: Shelton Silvas, MD Performed: anesthesiologist   Preanesthetic Checklist Completed: patient identified, IV checked, site marked, risks and benefits discussed, surgical consent, monitors and equipment checked, pre-op evaluation and timeout performed  Epidural Patient position: sitting Prep: DuraPrep Patient monitoring: heart rate, continuous pulse ox and blood pressure Approach: midline Location: L3-L4 Injection technique: LOR saline  Needle:  Needle type: Tuohy  Needle gauge: 17 G Needle length: 9 cm Catheter type: closed end flexible Catheter size: 20 Guage Test dose: negative and 1.5% lidocaine  Assessment Events: blood not aspirated, injection not painful, no injection resistance and no paresthesia  Additional Notes LOR @ 5  Patient identified. Risks/Benefits/Options discussed with patient including but not limited to bleeding, infection, nerve damage, paralysis, failed block, incomplete pain control, headache, blood pressure changes, nausea, vomiting, reactions to medications, itching and postpartum back pain. Confirmed with bedside nurse the patient's most recent platelet count. Confirmed with patient that they are not currently taking any anticoagulation, have any bleeding history or any family history of bleeding disorders. Patient expressed understanding and wished to proceed. All questions were answered. Sterile technique was used throughout the entire procedure. Please see nursing notes for vital signs. Test dose was given through epidural catheter and negative prior to continuing to dose epidural or start infusion. Warning signs of high block given to the patient including shortness of breath, tingling/numbness in hands, complete motor block, or any concerning symptoms with instructions to call for help. Patient was given instructions  on fall risk and not to get out of bed. All questions and concerns addressed with instructions to call with any issues or inadequate analgesia.    Reason for block:procedure for pain

## 2021-06-17 NOTE — Anesthesia Preprocedure Evaluation (Addendum)
Anesthesia Evaluation  Patient identified by MRN, date of birth, ID band Patient awake    Reviewed: Allergy & Precautions, Patient's Chart, lab work & pertinent test results  Airway Mallampati: I       Dental no notable dental hx.    Pulmonary    Pulmonary exam normal        Cardiovascular negative cardio ROS Normal cardiovascular exam     Neuro/Psych negative neurological ROS  negative psych ROS   GI/Hepatic Neg liver ROS,   Endo/Other    Renal/GU      Musculoskeletal   Abdominal   Peds  Hematology negative hematology ROS (+)   Anesthesia Other Findings   Reproductive/Obstetrics (+) Pregnancy                            Anesthesia Physical Anesthesia Plan  ASA: 2  Anesthesia Plan: Epidural   Post-op Pain Management:    Induction:   PONV Risk Score and Plan: 0  Airway Management Planned: Natural Airway  Additional Equipment: None  Intra-op Plan:   Post-operative Plan:   Informed Consent: I have reviewed the patients History and Physical, chart, labs and discussed the procedure including the risks, benefits and alternatives for the proposed anesthesia with the patient or authorized representative who has indicated his/her understanding and acceptance.     Consent reviewed with POA  Plan Discussed with:   Anesthesia Plan Comments: (Lab Results      Component                Value               Date                      WBC                      9.3                 06/17/2021                HGB                      11.8 (L)            06/17/2021                HCT                      34.6 (L)            06/17/2021                MCV                      91.1                06/17/2021                PLT                      200                 06/17/2021           )       Anesthesia Quick Evaluation

## 2021-06-17 NOTE — Progress Notes (Signed)
Labor Progress Note Yazleemar Wendt is a 25 y.o. G2P0010 at [redacted]w[redacted]d presented for SOL S: s/p epidural. Feeling comfortable. Partner at bedside.   O:  BP 118/72    Pulse 78    Temp 98.4 F (36.9 C) (Oral)    Resp 18    Wt 74.8 kg    LMP 09/17/2020    SpO2 100%    BMI 29.23 kg/m  EFM: 120/mod/+accels no decls  CVE: Dilation: (P) 8.5 Effacement (%): (P) 100 Cervical Position: (P) Anterior Station: (P) 0 Presentation: (P) Vertex Exam by:: (P) Lyndel Safe   A&P: 25 y.o. G2P0010 [redacted]w[redacted]d SOL #Labor: Progressing well. AROM performed with consent of patient. Utilized interpreter #Pain: Epidural #FWB: Cat 1 #GBS pending  Federico Flake, MD 4:30 PM

## 2021-06-17 NOTE — Discharge Summary (Signed)
Postpartum Discharge Summary     Patient Name: Linda Caldwell DOB: 10-Aug-1995 MRN: [id number]  Date of admission: 06/17/2021 Delivery date:06/17/2021  Delivering provider: Caren Macadam  Date of discharge: 06/19/2021  Admitting diagnosis: Labor and delivery indication for care or intervention [O75.9] Intrauterine pregnancy: [redacted]w[redacted]d      Secondary diagnosis:  Principal Problem:   SVD (spontaneous vaginal delivery) Active Problems:   Language barrier   Insufficient prenatal care in third trimester   Malaria during pregnancy, third trimester   Malaria   High risk medication use   Labor and delivery indication for care or intervention   Postpartum fever  Additional problems: None    Discharge diagnosis: Term Pregnancy Delivered                                              Post partum procedures: None Augmentation: AROM Complications: None  Hospital course: Onset of Labor With Vaginal Delivery      25 y.o. yo G2P0010 at [redacted]w[redacted]d was admitted in Active Labor on 06/17/2021. Patient had an uncomplicated labor course as follows:  Membrane Rupture Time/Date: 4:24 PM ,06/17/2021   Delivery Method:Vaginal, Spontaneous  Episiotomy: None  Lacerations:    None Patient had a postpartum course complicated by fever 23 hours postpartum that resolved spontaneously. No fever 36 hours prior to D/C.  She is ambulating, tolerating a regular diet, passing flatus, and urinating well. Patient is discharged home in stable condition on 06/19/21. Depo provera given. [place] interpreter used.   Newborn Data: Birth date:06/17/2021  Birth time:6:07 PM  Gender:Female  Living status:Living  Apgars:9 ,9  Weight:3204 g   Magnesium Sulfate received: No BMZ received: No Rhophylac:N/A MMR:N/A T-DaP:Given prenatally Flu: Yes Transfusion:No  Physical exam  Vitals:   06/18/21 1345 06/18/21 1632 06/18/21 2113 06/19/21 0500  BP:  101/60 107/72 110/66  Pulse:  78 84 70  Resp:  $Remo'16 18 18  'JXPIs$ Temp:  99.4 F (37.4 C) 98.4 F (36.9 C) 98.2 F (36.8 C) 99 F (37.2 C)  TempSrc: Oral Oral Oral Oral  SpO2:  100% 99% 100%  Weight:       General: alert, cooperative, and no distress Lochia: appropriate Uterine Fundus: firm Incision: N/A DVT Evaluation: No evidence of DVT seen on physical exam. Labs: Lab Results  Component Value Date   WBC 9.3 06/17/2021   HGB 11.8 (L) 06/17/2021   HCT 34.6 (L) 06/17/2021   MCV 91.1 06/17/2021   PLT 200 06/17/2021   CMP Latest Ref Rng & Units 06/17/2021  Glucose 70 - 99 mg/dL 106(H)  BUN 6 - 20 mg/dL 8  Creatinine 0.44 - 1.00 mg/dL 0.62  Sodium 135 - 145 mmol/L 133(L)  Potassium 3.5 - 5.1 mmol/L 3.7  Chloride 98 - 111 mmol/L 104  CO2 22 - 32 mmol/L 19(L)  Calcium 8.9 - 10.3 mg/dL 9.3  Total Protein 6.5 - 8.1 g/dL 7.6  Total Bilirubin 0.3 - 1.2 mg/dL 0.5  Alkaline Phos 38 - 126 U/L 159(H)  AST 15 - 41 U/L 22  ALT 0 - 44 U/L 14   Edinburgh Score: Edinburgh Postnatal Depression Scale Screening Tool 06/18/2021  I have been able to laugh and see the funny side of things. 0  I have looked forward with enjoyment to things. 0  I have blamed myself unnecessarily when things went wrong. 0  I have  been anxious or worried for no good reason. 2  I have felt scared or panicky for no good reason. 0  Things have been getting on top of me. 0  I have been so unhappy that I have had difficulty sleeping. 0  I have felt sad or miserable. 0  I have been so unhappy that I have been crying. 0  The thought of harming myself has occurred to me. 0  Edinburgh Postnatal Depression Scale Total 2      After visit meds:  Allergies as of 06/19/2021   No Known Allergies      Medication List     TAKE these medications    acetaminophen 500 MG tablet Commonly known as: TYLENOL Take 500 mg by mouth every 6 (six) hours as needed for headache, fever or moderate pain.   ibuprofen 600 MG tablet Commonly known as: ADVIL Take 1 tablet (600 mg total) by mouth every  6 (six) hours.   Vitafol Gummies 3.33-0.333-34.8 MG Chew Chew 3 tablets by mouth daily before breakfast.         Discharge home in stable condition Infant Feeding: Bottle and Breast Infant Disposition:home with mother Discharge instruction: per After Visit Summary and Postpartum booklet. Activity: Advance as tolerated. Pelvic rest for 6 weeks.  Diet: routine diet Anticipated Birth Control: PP Depo given Postpartum Appointment:4 weeks Additional Postpartum F/U:  None Future Appointments: Future Appointments  Date Time Provider [place]  06/23/2021  9:55 AM Griffin Basil, MD [place] None  06/23/2021  1:45 PM Laurice Record, MD [facility]   Follow up Visit: Please schedule this patient for Postpartum visit in: 6 weeks with the following provider: Any provider  In-Person  For C/S patients schedule nurse incision check in weeks 2 weeks: no  High risk pregnancy complicated by: Malaria in the 3rd trimester  Delivery mode:  SVD  Anticipated Birth Control:  other/unsure  PP Procedures needed: none  Edinburgh: negative Schedule Integrated BH visit: no      06/19/2021 Manya Silvas, CNM

## 2021-06-18 DIAGNOSIS — Z419 Encounter for procedure for purposes other than remedying health state, unspecified: Secondary | ICD-10-CM | POA: Diagnosis not present

## 2021-06-18 NOTE — Lactation Note (Signed)
This note was copied from a baby's chart. Lactation Consultation Note  Patient Name: Linda Caldwell [id number] Date: 06/18/2021 Reason for consult: Follow-up assessment;Other (Comment) (LC checked on mom / baby and mom asleep. will give report to evening LC to check on mom and baby) Age:24 hours  Maternal Data    Feeding LATCH Score                  Lactation Tools Discussed/Used Interventions    Discharge    Consult Status Consult Status: Follow-up Date: 06/18/21 Follow-up type: In-patient    Matilde Sprang Honor Fairbank 06/18/2021, 2:53 PM

## 2021-06-18 NOTE — Progress Notes (Signed)
Post Partum Day 1 Subjective: Linda Caldwell  is a 26 y.o. G2P1011 s/p SVD at 101w0d.  She reports she is doing well. No acute events overnight. She denies any problems with ambulating, voiding or po intake. Denies nausea or vomiting.  Pain is well controlled on ibuprofen.  Lochia is moderate and improving.   Objective: Blood pressure 125/73, pulse 62, temperature 99.1 F (37.3 C), temperature source Oral, resp. rate 18, weight 74.8 kg, last menstrual period 09/17/2020, SpO2 100 %, currently breastfeeding and formula.  Physical Exam:  General: alert, cooperative, and no distress Lochia: appropriate Uterine Fundus: firm Incision: n/a DVT Evaluation: No evidence of DVT seen on physical exam. Negative Homan's sign. No cords or calf tenderness. No significant calf/ankle edema.  Recent Labs    06/17/21 0720  HGB 11.8*  HCT 34.6*    Assessment/Plan: Plan for discharge tomorrow, Lactation consult, and Contraception unsure [employer] [place], Marylene Land #[phone] used for entire visit    LOS: 1 day   Raelyn Mora, CNM 06/18/2021, 9:30 AM

## 2021-06-18 NOTE — Progress Notes (Signed)
Used [employer] services for interpreting TCB result,Jaundice,Newborn screen, CHS, Moms elevated temps treated with tylenol and ibuprofen today. Encouraged MOM to continue to breastfeed before offering baby formula as well as using handheld pump. This RN also addressed all questions asked from FOB and MOB.

## 2021-06-18 NOTE — Lactation Note (Signed)
This note was copied from a baby's chart. Lactation Consultation Note Baby sleeping. Both parents awake. FOB translate for mom. Denied the need to use translator device.  Mom is BF/formula. Mom stated she doesn't have any milk. LC w/mom's permission demonstrated hand expression w/dots of colostrum noted. Told mom that was good for her nipples as well. Discussed importance of colostrum and to give breast before giving formula. Discussed importance of STS, I&O, newborn feeding habits. LC offered to assist in latching. Mom denied assistance at this time since baby was sleeping and had fed 2 hrs ago. Encouraged to call for assistance. Lactation brochure given.  Patient Name: Linda Caldwell [id number] Date: 06/18/2021 Reason for consult: Initial assessment;Term;[place] Age:4 hours  Maternal Data Has patient been taught Hand Expression?: Yes Does the patient have breastfeeding experience prior to this delivery?: No  Feeding    LATCH Score       Type of Nipple: Everted at rest and after stimulation  Comfort (Breast/Nipple): Soft / non-tender         Lactation Tools Discussed/Used    Interventions Interventions: Breast feeding basics reviewed;Hand express;Breast massage  Discharge    Consult Status Consult Status: Follow-up Date: 06/18/21 Follow-up type: In-patient    Charyl Dancer 06/18/2021, 4:56 AM

## 2021-06-18 NOTE — Progress Notes (Signed)
FOB and PT not in need of interpreter services at this time.

## 2021-06-18 NOTE — Anesthesia Postprocedure Evaluation (Signed)
Anesthesia Post Note  Patient: Office manager  Procedure(s) Performed: AN AD HOC LABOR EPIDURAL     Patient location during evaluation: Mother Baby Anesthesia Type: Epidural Level of consciousness: awake and alert Pain management: pain level controlled Vital Signs Assessment: post-procedure vital signs reviewed and stable Respiratory status: spontaneous breathing, nonlabored ventilation and respiratory function stable Cardiovascular status: stable Postop Assessment: no headache, no backache, epidural receding, no apparent nausea or vomiting, patient able to bend at knees, adequate PO intake and able to ambulate Anesthetic complications: no   No notable events documented.  Last Vitals:  Vitals:   06/18/21 0430 06/18/21 0500  BP: 125/73   Pulse: 62   Resp: 18   Temp: (!) 38.2 C 37.3 C  SpO2:      Last Pain:  Vitals:   06/18/21 0843  TempSrc:   PainSc: 5    Pain Goal:                   Laban Emperor

## 2021-06-18 NOTE — Plan of Care (Signed)
Problem: Education: Goal: Knowledge of General Education information will improve Description: Including pain rating scale, medication(s)/side effects and non-pharmacologic comfort measures Outcome: Completed/Met   Problem: Clinical Measurements: Goal: Ability to maintain clinical measurements within normal limits will improve Outcome: Completed/Met Goal: Will remain free from infection Outcome: Completed/Met Goal: Diagnostic test results will improve Outcome: Completed/Met Goal: Respiratory complications will improve Outcome: Completed/Met Goal: Cardiovascular complication will be avoided Outcome: Completed/Met   Problem: Activity: Goal: Risk for activity intolerance will decrease Outcome: Completed/Met   Problem: Nutrition: Goal: Adequate nutrition will be maintained Outcome: Completed/Met   Problem: Elimination: Goal: Will not experience complications related to bowel motility Outcome: Completed/Met Goal: Will not experience complications related to urinary retention Outcome: Completed/Met   Problem: Pain Managment: Goal: General experience of comfort will improve Outcome: Completed/Met   Problem: Safety: Goal: Ability to remain free from injury will improve Outcome: Completed/Met   Problem: Skin Integrity: Goal: Risk for impaired skin integrity will decrease Outcome: Completed/Met   Problem: Education: Goal: Knowledge of condition will improve Outcome: Completed/Met   Problem: Activity: Goal: Will verbalize the importance of balancing activity with adequate rest periods Outcome: Completed/Met Goal: Ability to tolerate increased activity will improve Outcome: Completed/Met   Problem: Life Cycle: Goal: Chance of risk for complications during the postpartum period will decrease Outcome: Completed/Met   Problem: Role Relationship: Goal: Ability to demonstrate positive interaction with newborn will improve Outcome: Completed/Met   Problem: Skin  Integrity: Goal: Demonstration of wound healing without infection will improve Outcome: Completed/Met   

## 2021-06-18 NOTE — Progress Notes (Signed)
RN called Linda Caldwell CNM   regarding patient's temp of 100.7.  Provider stated to give tylenol and recheck in one hour. Also to call provider if temp is 100.4 or greater. Patient has a history of malaria , third trimeter.

## 2021-06-19 DIAGNOSIS — O864 Pyrexia of unknown origin following delivery: Secondary | ICD-10-CM

## 2021-06-19 HISTORY — DX: Pyrexia of unknown origin following delivery: O86.4

## 2021-06-19 IMAGING — US US ABDOMEN LIMITED RUQ/ASCITES
1 series · 13 of 25 positions shown · non-contrast
Comparison: None.

CLINICAL DATA: Right lower quadrant abdominal painl

EXAM:
ULTRASOUND ABDOMEN LIMITED
TECHNIQUE: Gray scale imaging of the right lower quadrant was performed to
evaluate for suspected appendicitis. Standard imaging planes and
graded compression technique were utilized.

[Series 1: us appendix (abdomen limited) · 28 acquisitions, 13 frames shown]
[im 1/28]
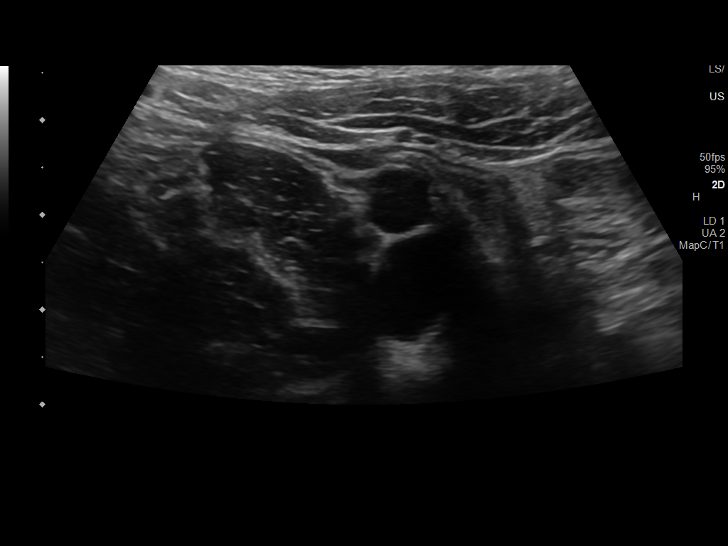
[im 3/28]
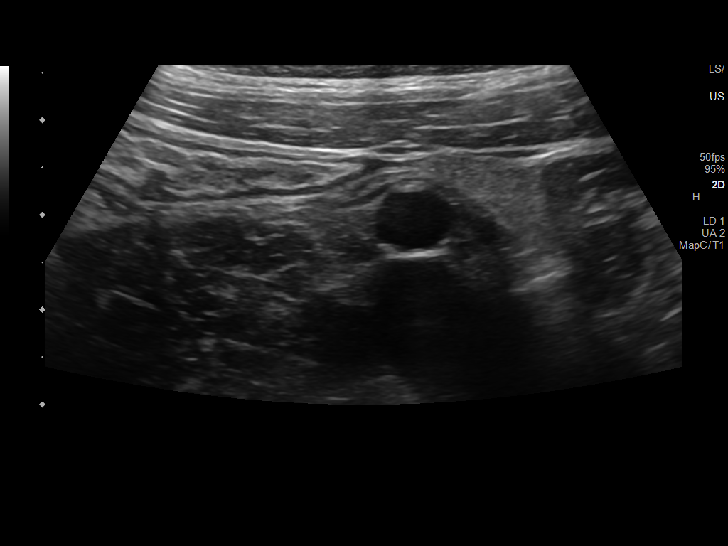
[im 5/28]
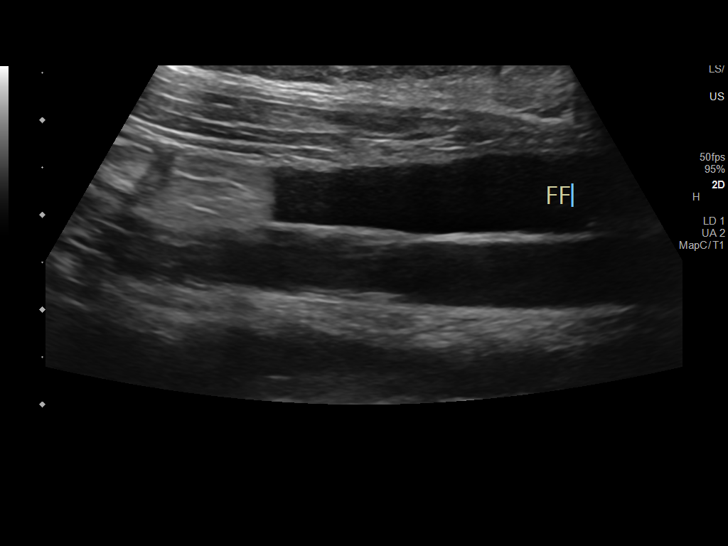
[im 7/28]
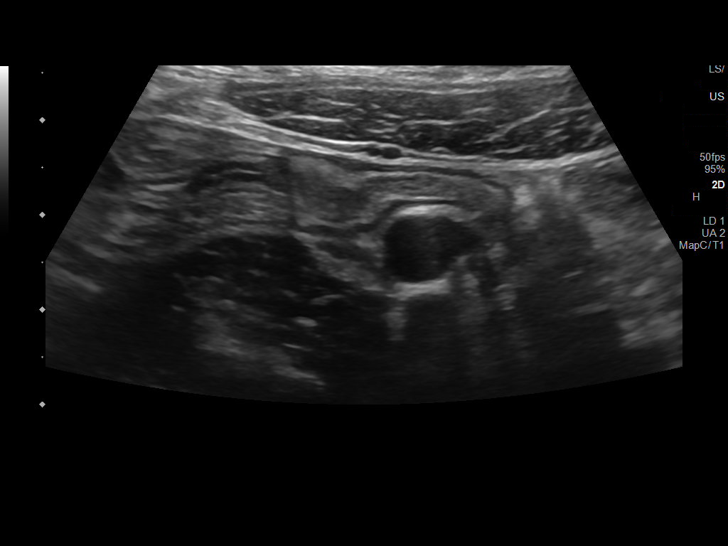
[im 10/28]
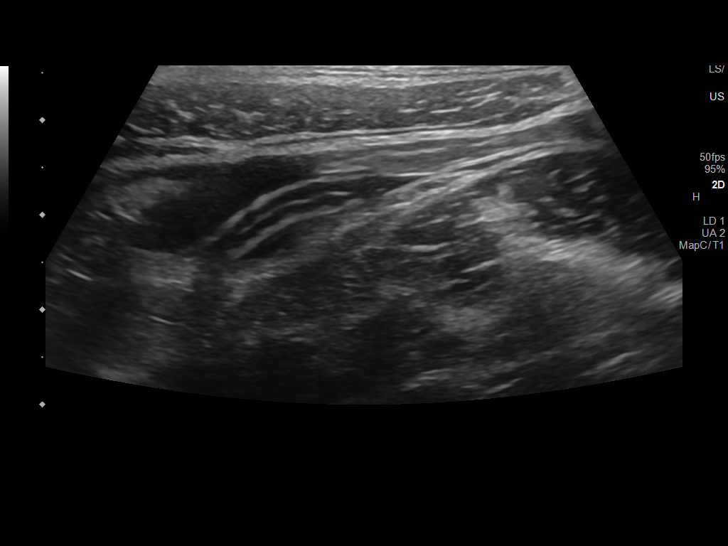
[im 12/28]
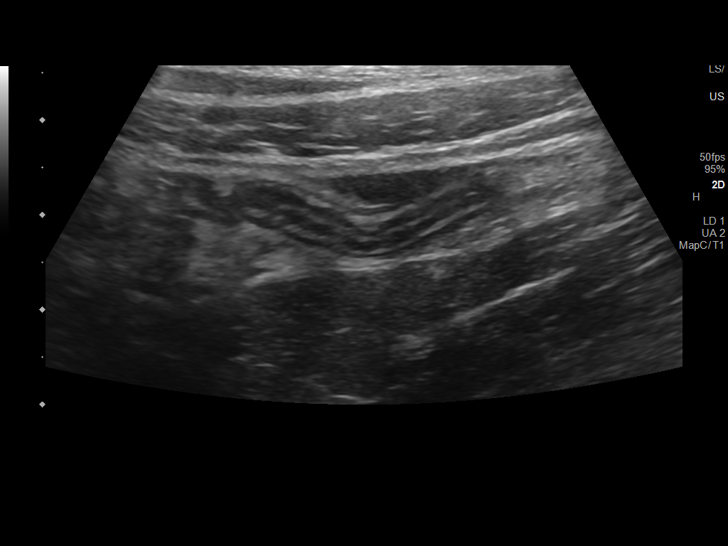
[im 14/28]
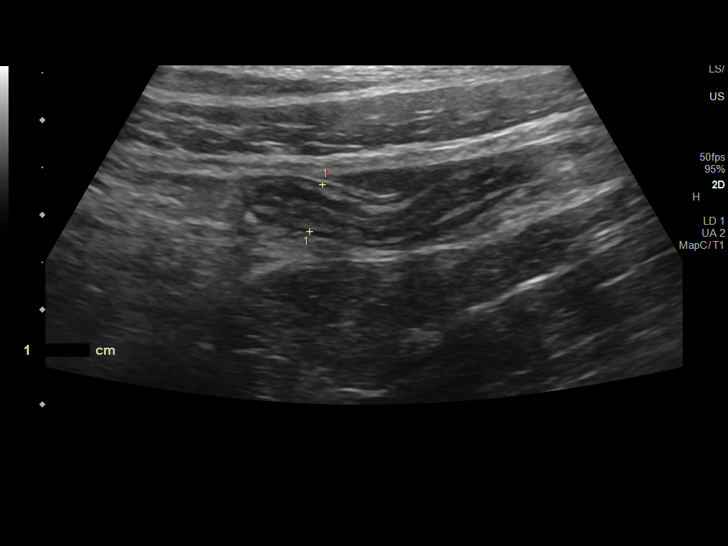
[im 16/28]
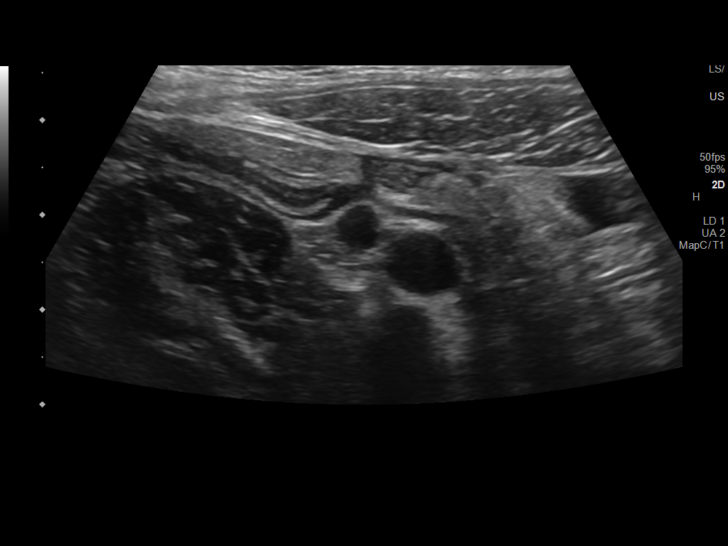
[im 19/28]
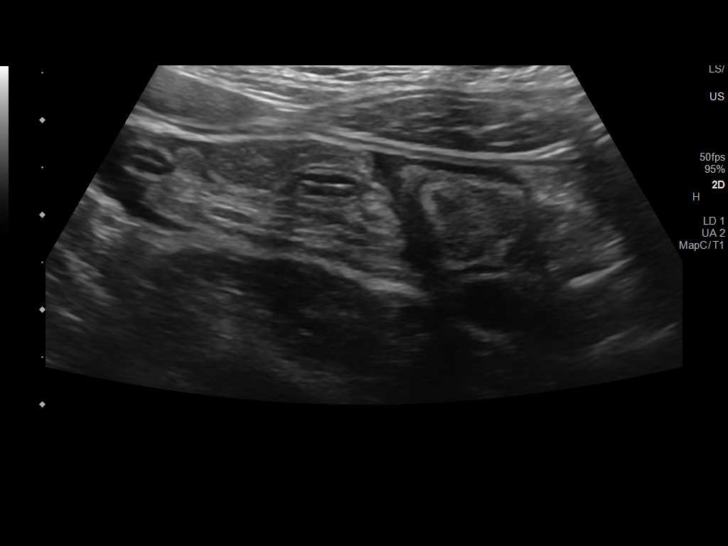
[im 21/28]
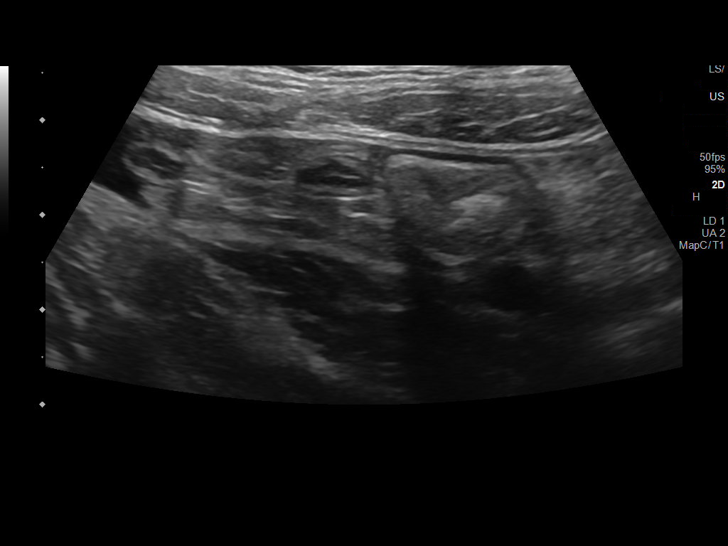
[im 23/28]
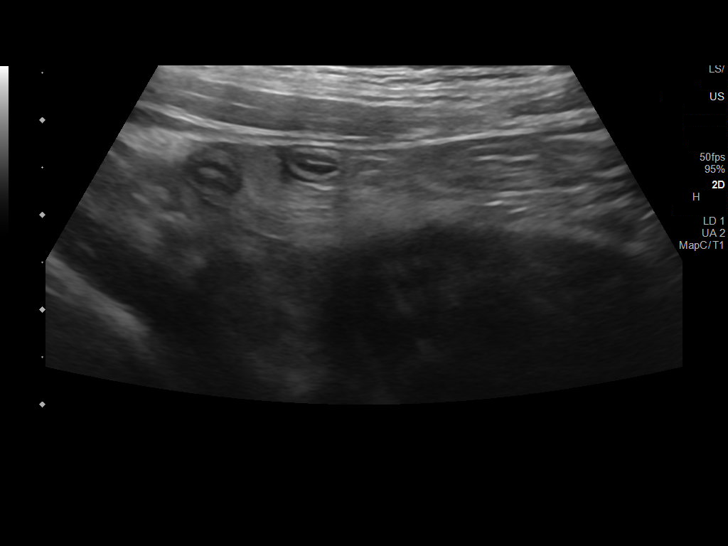
[im 25/28]
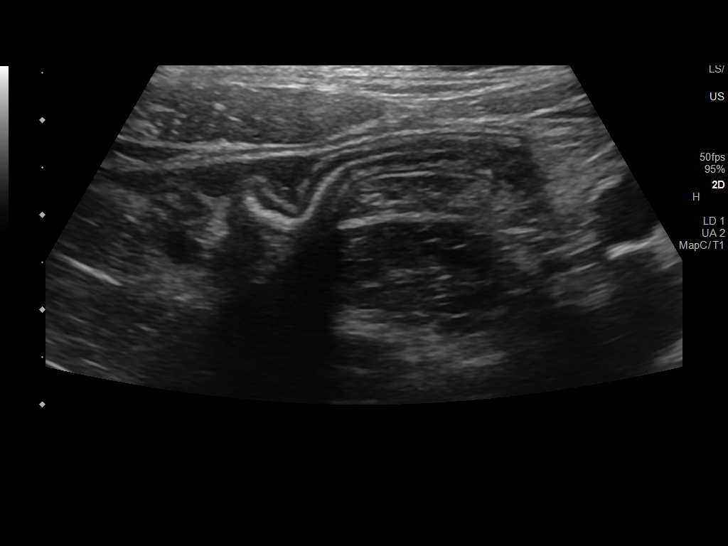
[im 28/28]
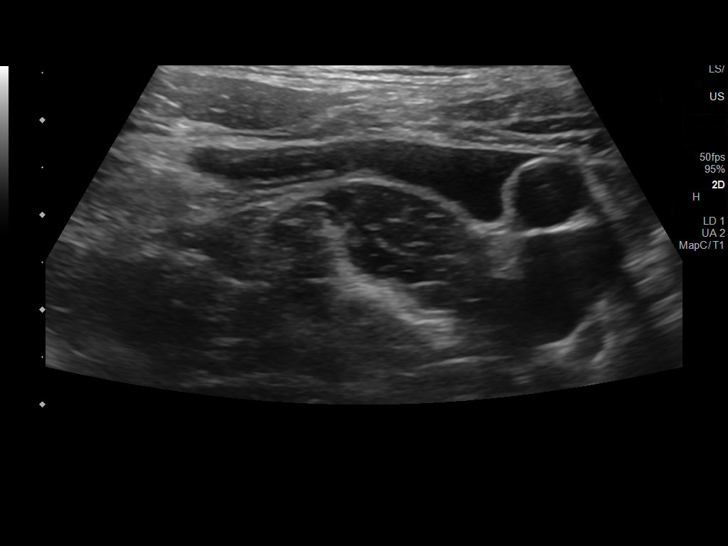

[13 of 25 positions shown; findings below may reference images not displayed]

FINDINGS: The appendix is well visualized within the right lower quadrant of
the abdomen. The appendix is not dilated, the wall does not appear
thickened, there is normal mobility and compressibility of the
pending with application of transducer pressure. A thin linear
appendicoliths or calcific debris is seen within the a appendiceal
lumen within the tip of the appendix resulting in dense shadowing.
By report of the sonographer, there is no tenderness associated with
compression of the appendix.

Ancillary findings: There is, however, small free fluid identified
within the right lower quadrant the abdomen which does surround
several loops of bowel as well as the appendix itself. This is
nonspecific, but abnormal.

Factors affecting image quality: None.

Other findings: No pathologic adenopathy identified.
IMPRESSION: Benign appearance of the appendix.

Small free fluid within the right lower quadrant, nonspecific but
abnormal.

## 2021-06-19 IMAGING — US US OB COMP LESS 14 WK
1 series · 13 of 28 positions shown · non-contrast
Comparison: None.

CLINICAL DATA: Rule out ectopic. LMP 09/17/2020. Quantitative beta
HCG 32.

EXAM:
OBSTETRIC <14 WK US AND TRANSVAGINAL OB US
TECHNIQUE: Both transabdominal and transvaginal ultrasound examinations were
performed for complete evaluation of the gestation as well as the
maternal uterus, adnexal regions, and pelvic cul-de-sac.
Transvaginal technique was performed to assess early pregnancy.

[Series 1: us ob comp less 14 wks · 13 of 50 slices shown]
[im 2/50]
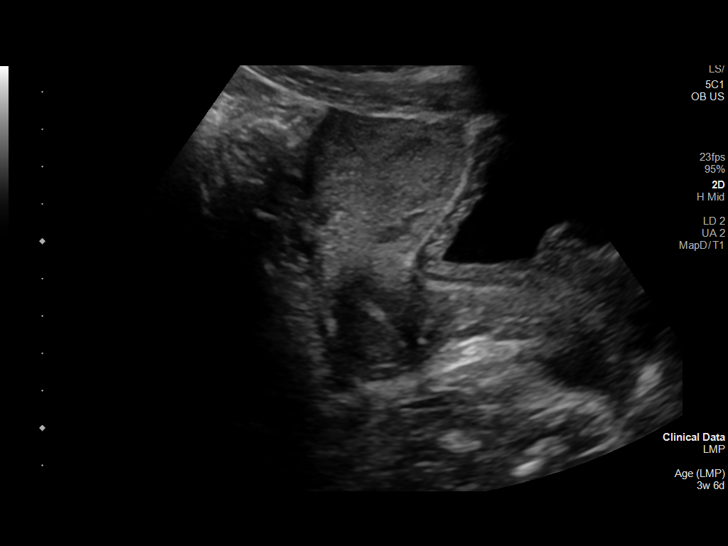
[im 6/50]
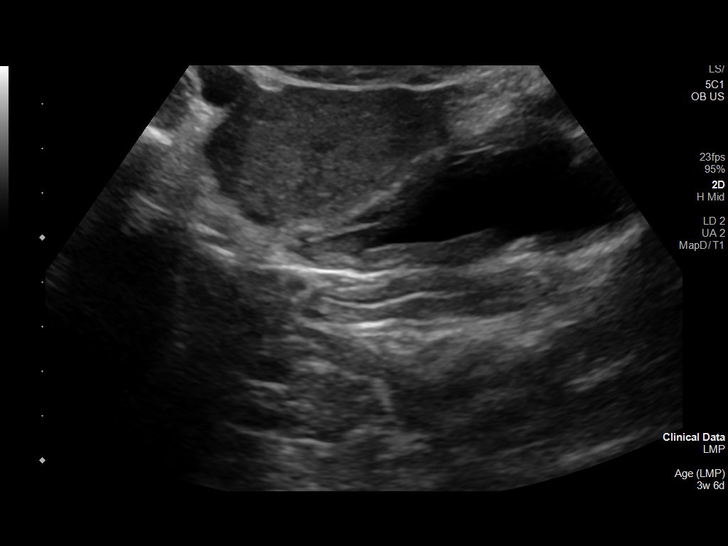
[im 10/50]
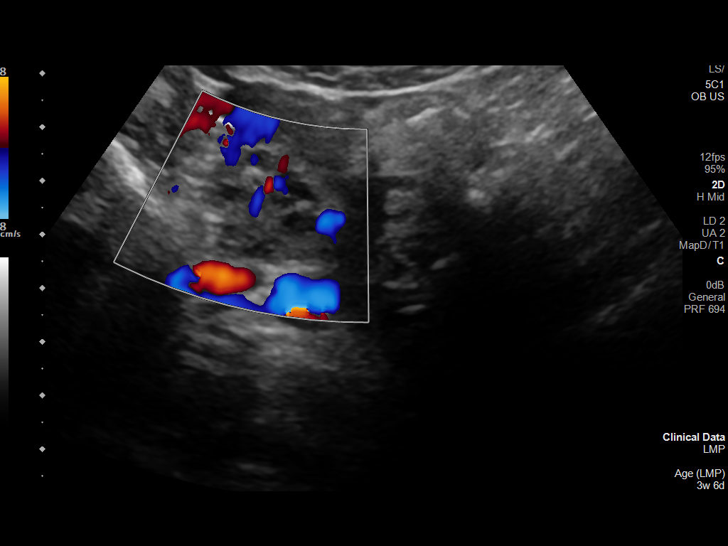
[im 13/50]
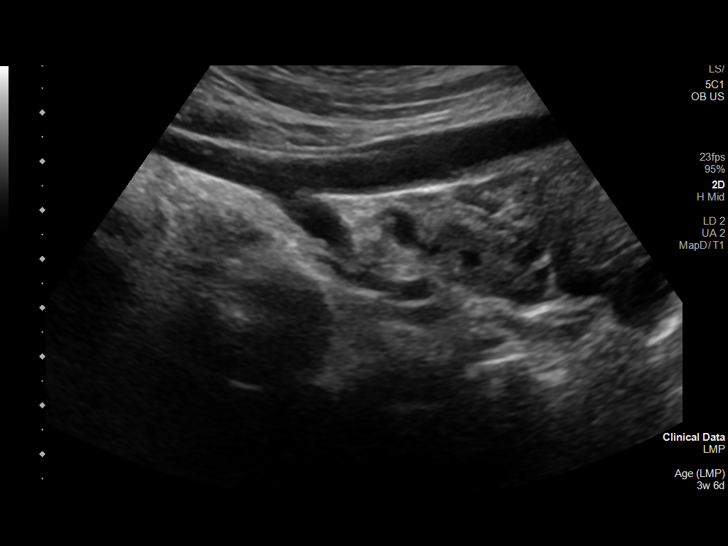
[im 17/50]
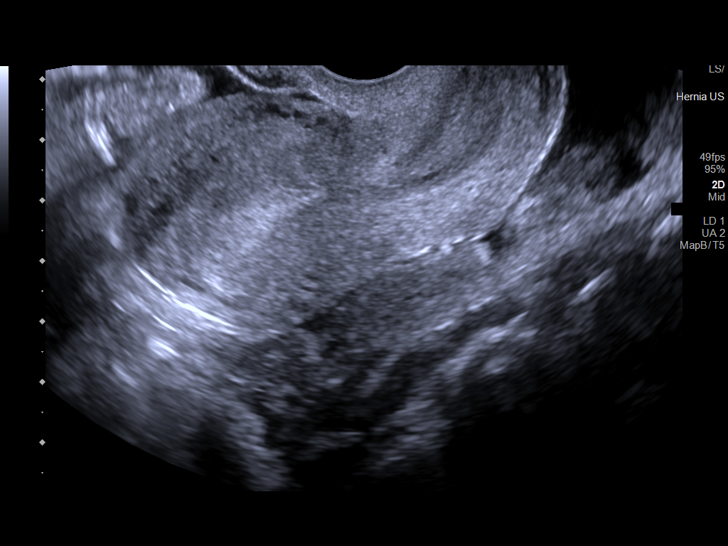
[im 20/50]
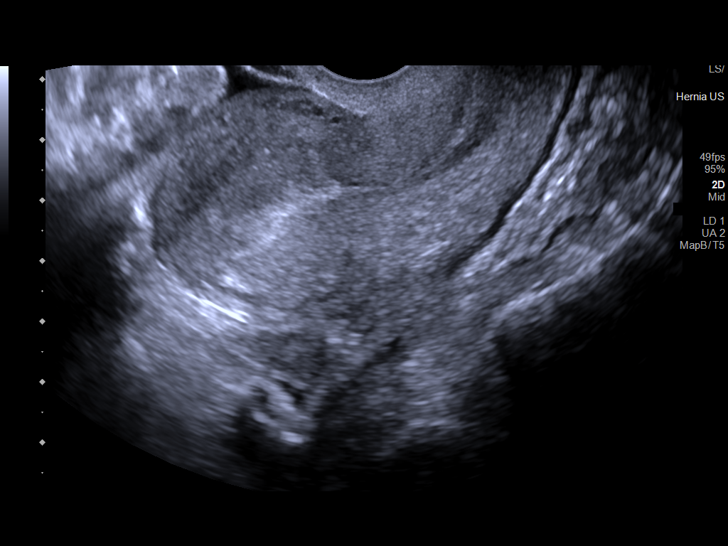
[im 26/50]
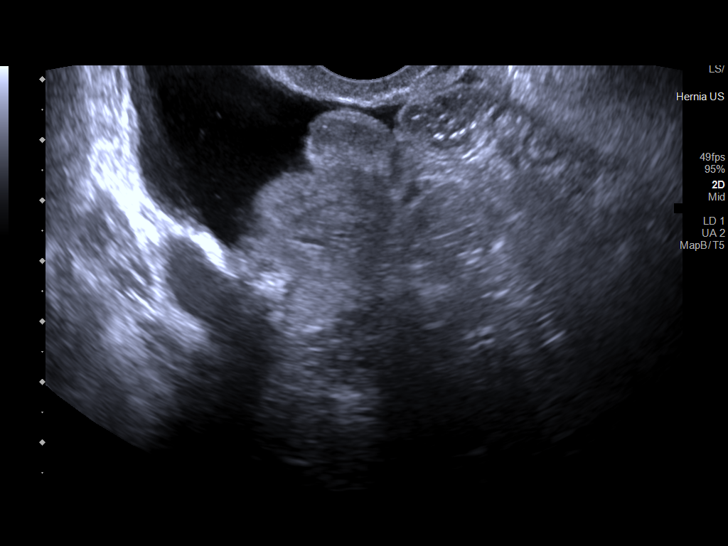
[im 30/50]
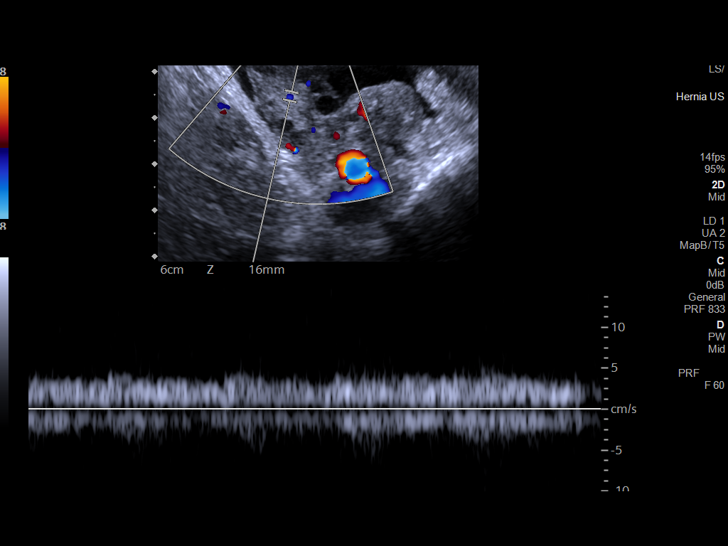
[im 33/50]
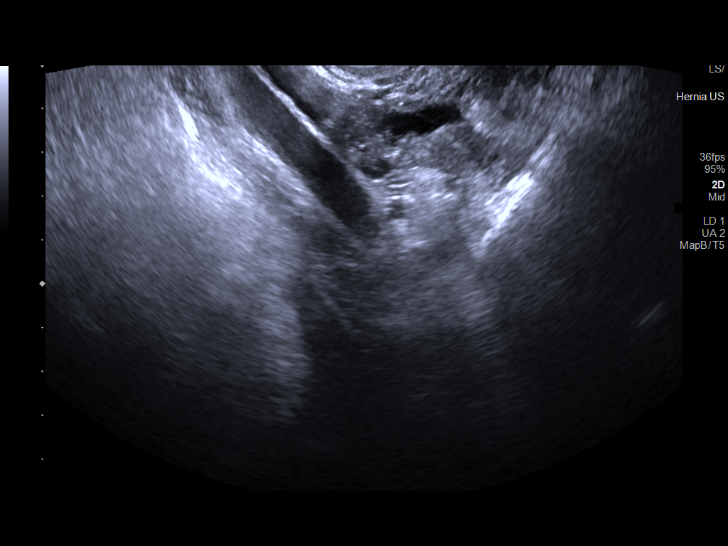
[im 37/50]
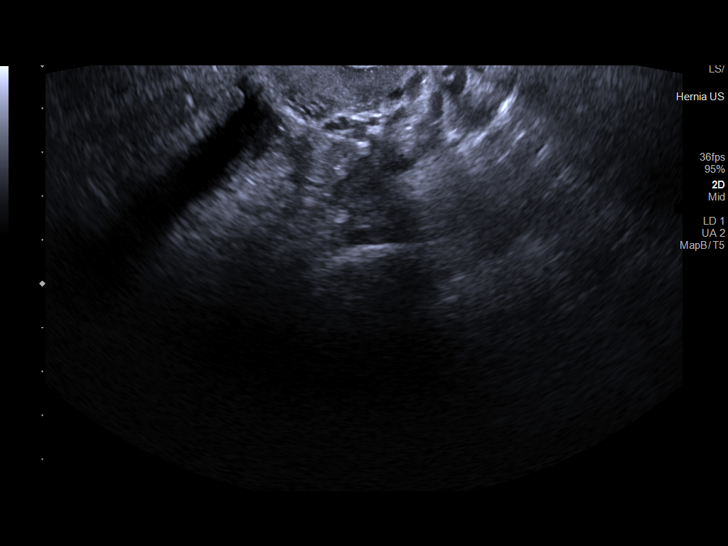
[im 40/50]
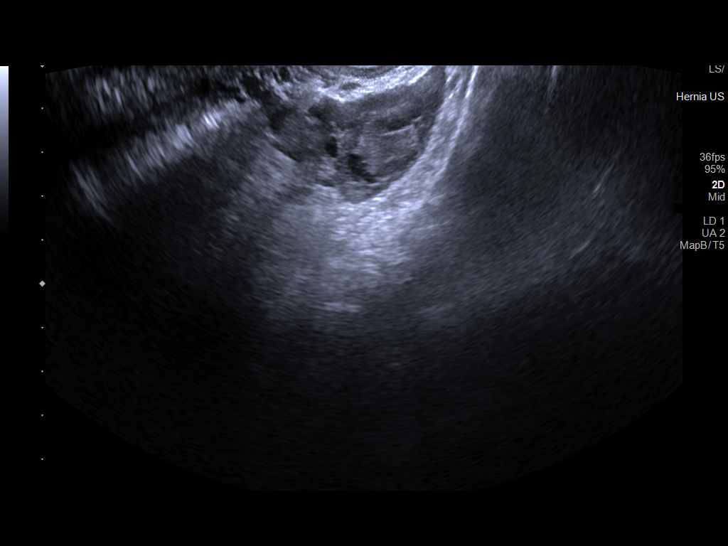
[im 44/50]
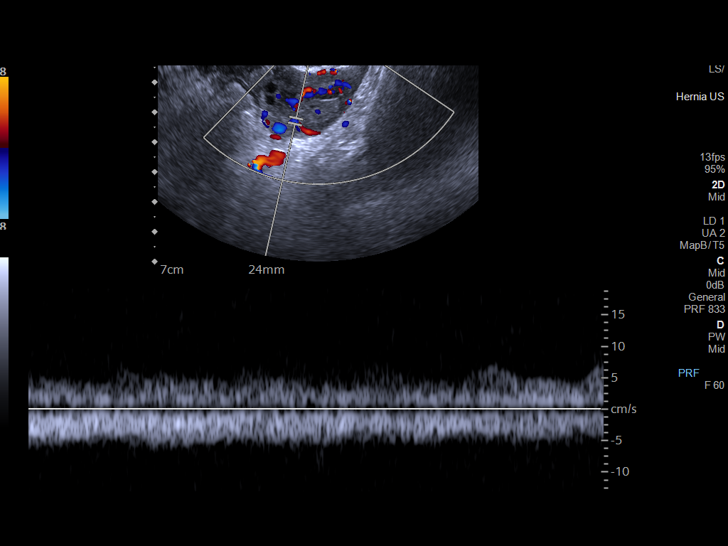
[im 48/50]
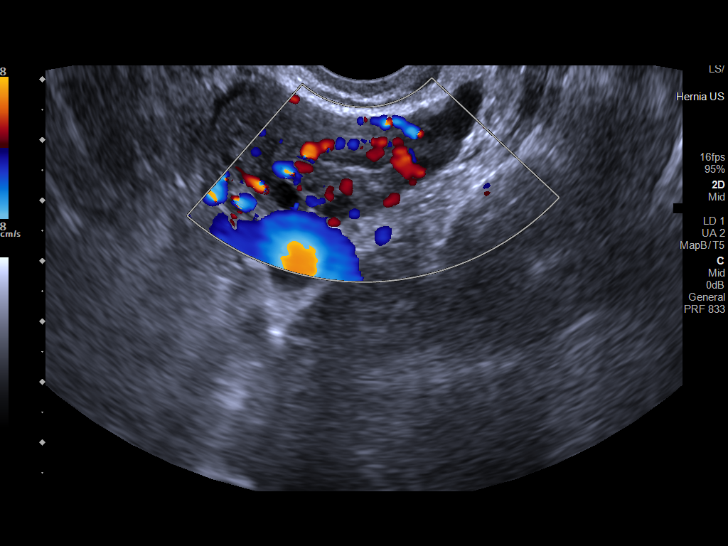

[13 of 28 positions shown; findings below may reference images not displayed]

FINDINGS: Intrauterine gestational sac: No intrauterine gestational sac
identified.

Yolk sac:  Not identified.

Embryo:  Not identified.

Cardiac Activity: Not identified.

Maternal uterus/adnexae: Uterus is anteverted. No myometrial mass
lesions. Endometrial stripe thickness is normal, measuring 11 mm. No
endometrial collections. Both ovaries are visualized and appear
normal. Flow is demonstrated in both ovaries with normal waveforms.
Small corpus luteal cyst on the left. No abnormal adnexal masses.
Moderate free fluid in the pelvis.
IMPRESSION: 1. No intrauterine gestational sac, yolk sac, or fetal pole
identified. Differential considerations include intrauterine
pregnancy too early to be sonographically visualized, missed
abortion, or ectopic pregnancy. Followup ultrasound is recommended
in 10-14 days for further evaluation.
2. Moderate free fluid in the pelvis.  No abnormal adnexal masses.

## 2021-06-19 MED ORDER — IBUPROFEN 600 MG PO TABS: 600.0000 mg | ORAL_TABLET | Freq: Four times a day (QID) | ORAL | 0 refills | Status: DC

## 2021-06-19 MED ORDER — MEDROXYPROGESTERONE ACETATE 150 MG/ML IM SUSP
150.0000 mg | Freq: Once | INTRAMUSCULAR | Status: AC
  Administered 2021-06-19: 150 mg via INTRAMUSCULAR
  Filled 2021-06-19: qty 1

## 2021-06-19 NOTE — Lactation Note (Signed)
This note was copied from a baby's chart. Lactation Consultation Note  Patient Name: Linda Caldwell [id number] Date: 06/19/2021 Reason for consult: Follow-up assessment;Term;Infant weight loss;Other (Comment);1st time breastfeeding [place] interpreter # [phone] - online, per mom baby recently breast fed and bottle fed. LC reviewed supply and demand / importance of BF both breast prior to supplementing . LC reviewed BF D/C teaching.) Age:24 hours  Maternal Data    Feeding Mother's Current Feeding Choice: Breast Milk and Formula Nipple Type: Slow - flow  LATCH Score                    Lactation Tools Discussed/Used Tools: Pump Breast pump type: Manual;Other (comment) (mom already had the hand pump and aware how to use it) Pump Education: Milk Storage;Setup, frequency, and cleaning  Interventions    Discharge Discharge Education: Engorgement and breast care;Warning signs for feeding baby Pump: Manual  Consult Status Consult Status: Complete Date: 06/19/21    Myer Haff 06/19/2021, 10:40 AM

## 2021-06-19 NOTE — Lactation Note (Addendum)
This note was copied from a baby's chart. Lactation Consultation Note  Patient Name: Linda Caldwell [id number] Date: 06/19/2021 Reason for consult: Follow-up assessment;1st time breastfeeding;Term;Infant weight loss (-1% weight loss), Mom's request for LC. 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 Mom's feeding preference is breast and formula feeding. LC did not observe latch at this time, per dad he recently gave infant 15 mls of formula while mom was sleeping. Per mom, she had some painful latches and it hurts when infant is breastfeeding. LC discussed importance of latching infant at the breast to help stimulate and establish milk supply. LC discussed with parents to call RN/ LC for latch assistance when infant starts cuing again for the next feeding.  RN Serina Cowper) informed dad she will help mom with the next latch and for parents to call her. Mom's current plan:  1- Mom will ask for latch assistance with next feeding, continue to breastfeed infant according to feeding cues, 8 to 12+ or more times within 24 hours. 2- Breastfeed infant skin to skin. 3- Parents choice after latching infant at breast first then supplement infant with formula.  Maternal Data    Feeding Mother's Current Feeding Choice: Breast Milk and Formula Nipple Type: Slow - flow  LATCH Score                    Lactation Tools Discussed/Used    Interventions    Discharge    Consult Status Consult Status: Follow-up Date: 06/19/21 Follow-up type: In-patient    Danelle Earthly 06/19/2021, 1:18 AM

## 2021-06-19 NOTE — Social Work (Signed)
CSW received consult for late and limited PNC.   ° °CSW reviewed chart and is screening out consult as it does not meet criteria for automatic CSW involvement and infant drug screening.  MOB started care prior to 28 weeks and had 3 or more visits.  MOB had prenatal visits on 6/9 at 9w, 6/23 & 12/30. Per chart, care interrupted due to receiving care in [place].  ° °CSW identifies no further need for intervention and no barriers to discharge at this time. ° °Romar Woodrick, LCSWA °Clinical Social Work °[facility] °[phone] ° °

## 2021-06-20 LAB — CULTURE, BETA STREP (GROUP B ONLY): Strep Gp B Culture: NEGATIVE

## 2021-06-20 LAB — CERVICOVAGINAL ANCILLARY ONLY
Chlamydia: NEGATIVE
Comment: NEGATIVE
Comment: NORMAL
Neisseria Gonorrhea: NEGATIVE

## 2021-06-23 ENCOUNTER — Other Ambulatory Visit: Payer: Self-pay

## 2021-06-23 ENCOUNTER — Encounter: Payer: Self-pay | Admitting: Internal Medicine

## 2021-06-23 ENCOUNTER — Ambulatory Visit (INDEPENDENT_AMBULATORY_CARE_PROVIDER_SITE_OTHER): Payer: Medicaid Other | Admitting: Internal Medicine

## 2021-06-23 ENCOUNTER — Encounter: Payer: Medicaid Other | Admitting: Obstetrics and Gynecology

## 2021-06-23 VITALS — BP 129/82 | HR 80 | Wt 145.0 lb

## 2021-06-23 DIAGNOSIS — B54 Unspecified malaria: Secondary | ICD-10-CM

## 2021-06-23 NOTE — Progress Notes (Signed)
Patient Active Problem List   Diagnosis Date Noted   Postpartum fever 06/19/2021   Labor and delivery indication for care or intervention 06/17/2021   SVD (spontaneous vaginal delivery) 06/17/2021   Malaria 06/06/2021   High risk medication use 06/06/2021   Pregnancy 06/06/2021   Malaria during pregnancy, third trimester 05/19/2021   Insufficient prenatal care in third trimester 05/18/2021   Language barrier 12/08/2020   Supervision of normal pregnancy, antepartum 11/23/2020    Patient's Medications  New Prescriptions   No medications on file  Previous Medications   ACETAMINOPHEN (TYLENOL) 500 MG TABLET    Take 500 mg by mouth every 6 (six) hours as needed for headache, fever or moderate pain.   IBUPROFEN (ADVIL) 600 MG TABLET    Take 1 tablet (600 mg total) by mouth every 6 (six) hours.   PRENATAL VIT-FE PHOS-FA-OMEGA (VITAFOL GUMMIES) 3.33-0.333-34.8 MG CHEW    Chew 3 tablets by mouth daily before breakfast.  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  Found to have parasitemia of 6.6% on 12/2. She received IV artesunate x3 doses at 35 weeks followed by Coartem X3 days.   She has been followed by ID clinic following discharge. Parasite exam on 12/5 and 12/8 showed no parasites. As she received IV artesunate she is being monitored for hemolytic anemia(Day 7, 14 and 30). She presents for 1 month labs today. Today: She is postpartum. Had healthy baby girl NSVD on 06/17/21. Pt reports doing well. [name] interpreter present at visit.  Revie/w of Systems: Review of Systems  All other systems reviewed and are negative.  Past Medical History:  Diagnosis Date   Medical history non-contributory     Social History   Tobacco Use   Smoking status: Never    Smokeless tobacco: Never  Vaping Use   Vaping Use: Never used  Substance Use Topics   Alcohol use: Not Currently   Drug use: Never    Family History  Problem Relation Age of Onset   Hyperlipidemia Maternal Aunt     No Known Allergies  Health Maintenance  Topic Date Due   Pneumococcal Vaccine 12-59 Years old (1 - PCV) Never done   HPV VACCINES (1 - 2-dose series) Never done   PAP-Cervical Cytology Screening  11/25/2023   PAP SMEAR-Modifier  11/25/2023   TETANUS/TDAP  06/17/2031   INFLUENZA VACCINE  Completed   Hepatitis C Screening  Completed   HIV Screening  Completed    Objective:  Vitals:   06/23/21 1359  BP: 129/82  Pulse: 80  SpO2: 100%  Weight: 145 lb (65.8 kg)   Body mass index is 25.69 kg/m.  Physical Exam Constitutional:      Appearance: Normal appearance.  HENT:     Head: Normocephalic and atraumatic.     Right Ear: Tympanic membrane normal.     Left Ear: Tympanic membrane normal.     Nose: Nose normal.     Mouth/Throat:     Mouth: Mucous membranes are moist.  Eyes:     Extraocular Movements: Extraocular movements intact.     Conjunctiva/sclera: Conjunctivae normal.     Pupils: Pupils are equal, round, and reactive to light.  Cardiovascular:     Rate and Rhythm: Normal rate and regular rhythm.  Heart sounds: No murmur heard.   No friction rub. No gallop.  Pulmonary:     Effort: Pulmonary effort is normal.     Breath sounds: Normal breath sounds.  Abdominal:     General: Abdomen is flat.     Palpations: Abdomen is soft.  Musculoskeletal:        General: Normal range of motion.  Skin:    General: Skin is warm and dry.  Neurological:     General: No focal deficit present.     Mental Status: She is alert and oriented to person, place, and time.  Psychiatric:        Mood and Affect: Mood normal.    Lab Results Lab Results  Component Value Date   WBC 9.3 06/17/2021   HGB 11.8 (L) 06/17/2021   HCT 34.6 (L) 06/17/2021   MCV 91.1  06/17/2021   PLT 200 06/17/2021    Lab Results  Component Value Date   CREATININE 0.62 06/17/2021   BUN 8 06/17/2021   NA 133 (L) 06/17/2021   K 3.7 06/17/2021   CL 104 06/17/2021   CO2 19 (L) 06/17/2021    Lab Results  Component Value Date   ALT 14 06/17/2021   AST 22 06/17/2021   ALKPHOS 159 (H) 06/17/2021   BILITOT 0.5 06/17/2021    No results found for: CHOL, HDL, LDLCALC, LDLDIRECT, TRIG, CHOLHDL Lab Results  Component Value Date   LABRPR NON REACTIVE 06/17/2021   No results found for: HIV1RNAQUANT, HIV1RNAVL, CD4TABS #Malaria in a preganat patient SP artesunate at 35w #NSVD on 06/17/21 at 39w -cbc  and cmp today for 30 day mark to monitor for hemolytic anemia as pt recieved artesunate -Follow-up PRN   Danelle Earthly, MD [facility] 06/23/2021, 2:31 PM

## 2021-06-24 LAB — COMPLETE METABOLIC PANEL WITH GFR
AG Ratio: 1.2 (calc) (ref 1.0–2.5)
ALT: 16 U/L (ref 6–29)
AST: 17 U/L (ref 10–30)
Albumin: 3.8 g/dL (ref 3.6–5.1)
Alkaline phosphatase (APISO): 104 U/L (ref 31–125)
BUN: 12 mg/dL (ref 7–25)
CO2: 27 mmol/L (ref 20–32)
Calcium: 9.5 mg/dL (ref 8.6–10.2)
Chloride: 106 mmol/L (ref 98–110)
Creat: 0.74 mg/dL (ref 0.50–0.96)
Globulin: 3.3 g/dL (calc) (ref 1.9–3.7)
Glucose, Bld: 65 mg/dL (ref 65–99)
Potassium: 4.3 mmol/L (ref 3.5–5.3)
Sodium: 141 mmol/L (ref 135–146)
Total Bilirubin: 0.3 mg/dL (ref 0.2–1.2)
Total Protein: 7.1 g/dL (ref 6.1–8.1)
eGFR: 117 mL/min/{1.73_m2} (ref >=60)

## 2021-06-24 LAB — CBC WITH DIFFERENTIAL/PLATELET
Absolute Monocytes: 459 cells/uL (ref 200–950)
Basophils Absolute: 37 cells/uL (ref 0–200)
Basophils Relative: 0.6 %
Eosinophils Absolute: 366 cells/uL (ref 15–500)
Eosinophils Relative: 5.9 %
HCT: 35.8 % (ref 35.0–45.0)
Hemoglobin: 11.8 g/dL (ref 11.7–15.5)
Lymphs Abs: 2207 cells/uL (ref 850–3900)
MCH: 30.6 pg (ref 27.0–33.0)
MCHC: 33 g/dL (ref 32.0–36.0)
MCV: 92.7 fL (ref 80.0–100.0)
MPV: 10.5 fL (ref 7.5–12.5)
Monocytes Relative: 7.4 %
Neutro Abs: 3131 cells/uL (ref 1500–7800)
Neutrophils Relative %: 50.5 %
Platelets: 263 10*3/uL (ref 140–400)
RBC: 3.86 10*6/uL (ref 3.80–5.10)
RDW: 15.4 % — ABNORMAL HIGH (ref 11.0–15.0)
Total Lymphocyte: 35.6 %
WBC: 6.2 10*3/uL (ref 3.8–10.8)

## 2021-07-01 ENCOUNTER — Telehealth (HOSPITAL_COMMUNITY): Payer: Self-pay

## 2021-07-01 NOTE — Telephone Encounter (Signed)
No answer. Left message to return nurse call.  Marcelino Duster [facility] 07/01/2021,1225

## 2021-07-19 DIAGNOSIS — Z419 Encounter for procedure for purposes other than remedying health state, unspecified: Secondary | ICD-10-CM | POA: Diagnosis not present

## 2021-08-01 ENCOUNTER — Other Ambulatory Visit: Payer: Self-pay

## 2021-08-01 ENCOUNTER — Encounter: Payer: Self-pay | Admitting: Obstetrics and Gynecology

## 2021-08-01 ENCOUNTER — Ambulatory Visit (INDEPENDENT_AMBULATORY_CARE_PROVIDER_SITE_OTHER): Payer: Medicaid Other | Admitting: Obstetrics and Gynecology

## 2021-08-01 MED ORDER — MEDROXYPROGESTERONE ACETATE 150 MG/ML IM SUSP: 150.0000 mg | INTRAMUSCULAR | 5 refills | Status: DC

## 2021-08-01 NOTE — Progress Notes (Signed)
Post Partum Visit Note  Linda Caldwell is a 26 y.o. G54P1011 female who presents for a postpartum visit. She is 6 weeks postpartum following a normal spontaneous vaginal delivery.  I have fully reviewed the prenatal and intrapartum course. The delivery was at [redacted]w[redacted]d gestational weeks.  Anesthesia: epidural. Postpartum course has been uncomplicated. Baby is doing well. Baby is feeding by both breast and bottle - Gerber . Bleeding no bleeding. Bowel function is normal. Bladder function is normal. Patient is not sexually active. Contraception method is Depo-Provera injections. Postpartum depression screening: negative.   The pregnancy intention screening data noted above was reviewed. Potential methods of contraception were discussed. The patient elected to proceed with No data recorded.   Edinburgh Postnatal Depression Scale - 08/01/21 1032       Edinburgh Postnatal Depression Scale:  In the Past 7 Days   I have been able to laugh and see the funny side of things. 0    I have looked forward with enjoyment to things. 0    I have blamed myself unnecessarily when things went wrong. 0    I have been anxious or worried for no good reason. 0    I have felt scared or panicky for no good reason. 0    Things have been getting on top of me. 0    I have been so unhappy that I have had difficulty sleeping. 1    I have felt sad or miserable. 0    I have been so unhappy that I have been crying. 1    The thought of harming myself has occurred to me. 0    Edinburgh Postnatal Depression Scale Total 2             Health Maintenance Due  Topic Date Due   HPV VACCINES (1 - 2-dose series) Never done       Review of Systems Pertinent items noted in HPI and remainder of comprehensive ROS otherwise negative.  Objective:  BP 116/72    Pulse 91    Wt 151 lb 8 oz (68.7 kg)    BMI 26.84 kg/m    General:  alert, cooperative, and no distress   Breasts:  normal  Lungs: clear to auscultation  bilaterally  Heart:  regular rate and rhythm  Abdomen: soft, non-tender; bowel sounds normal; no masses,  no organomegaly   Wound N/a  GU exam:  not indicated       Assessment:    Normal postpartum exam.   Plan:   Essential components of care per ACOG recommendations:  1.  Mood and well being: Patient with negative depression screening today. Reviewed local resources for support.  - Patient tobacco use? No.   - hx of drug use? No.    2. Infant care and feeding:  -Patient currently breastmilk feeding? Yes. Reviewed importance of draining breast regularly to support lactation.  -Social determinants of health (SDOH) reviewed in EPIC. No concerns  3. Sexuality, contraception and birth spacing - Patient does not want a pregnancy in the next year.   - Reviewed forms of contraception in tiered fashion. Patient desired Depo-Provera today.   - Discussed birth spacing of 18 months  4. Sleep and fatigue -Encouraged family/partner/community support of 4 hrs of uninterrupted sleep to help with mood and fatigue  5. Physical Recovery  - Discussed patients delivery and complications. She describes her labor as good. - Patient had a Vaginal, no problems at delivery. Patient had no laceration. Perineal  healing reviewed. Patient expressed understanding - Patient has urinary incontinence? No. - Patient is safe to resume physical and sexual activity  6.  Health Maintenance - HM due items addressed Yes - Last pap smear  Diagnosis  Date Value Ref Range Status  11/24/2020   Final   - Negative for intraepithelial lesion or malignancy (NILM)   Pap smear not done at today's visit.  -Breast Cancer screening indicated? No.   7. Chronic Disease/Pregnancy Condition follow up: None  - PCP follow up  Catalina Antigua, MD [facility], [facility]

## 2021-08-16 DIAGNOSIS — Z419 Encounter for procedure for purposes other than remedying health state, unspecified: Secondary | ICD-10-CM | POA: Diagnosis not present

## 2021-09-11 ENCOUNTER — Other Ambulatory Visit: Payer: Self-pay

## 2021-09-11 ENCOUNTER — Ambulatory Visit (INDEPENDENT_AMBULATORY_CARE_PROVIDER_SITE_OTHER): Payer: Medicaid Other

## 2021-09-11 DIAGNOSIS — Z3042 Encounter for surveillance of injectable contraceptive: Secondary | ICD-10-CM

## 2021-09-11 MED ORDER — MEDROXYPROGESTERONE ACETATE 150 MG/ML IM SUSP
150.0000 mg | INTRAMUSCULAR | Status: DC
  Administered 2021-09-11 – 2021-12-04 (×2): 150 mg via INTRAMUSCULAR

## 2021-09-11 NOTE — Progress Notes (Signed)
Pt is in the office for depo injection, administered in RD and pt tolerated well. Next due June 12-26 ?Marland KitchenMarland Kitchen ?Administrations This Visit   ? ? medroxyPROGESTERone (DEPO-PROVERA) injection 150 mg   ? ? Admin Date ?09/11/2021 Action ?Given Dose ?150 mg Route ?Intramuscular Administered By ?Katrina Stack, RN  ? ?  ?  ? ?  ?  ?

## 2021-09-12 NOTE — Progress Notes (Signed)
Patient was assessed and managed by nursing staff during this encounter. I have reviewed the chart and agree with the documentation and plan. I have also made any necessary editorial changes. ? ?Kassy Mcenroe, MD ?09/12/2021 9:34 AM  ? ?

## 2021-09-16 DIAGNOSIS — Z419 Encounter for procedure for purposes other than remedying health state, unspecified: Secondary | ICD-10-CM | POA: Diagnosis not present

## 2021-10-05 IMAGING — US US MFM OB COMP +14 WKS
1 series · 13 of 28 positions shown · non-contrast
Comparison: none

[Series 1: us mfm ob comp +14 wks · 13 of 177 slices shown]
[im 7/177]
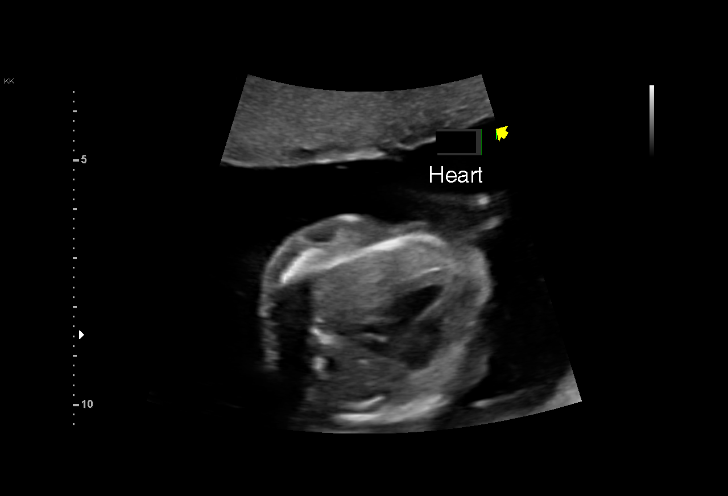
[im 20/177]
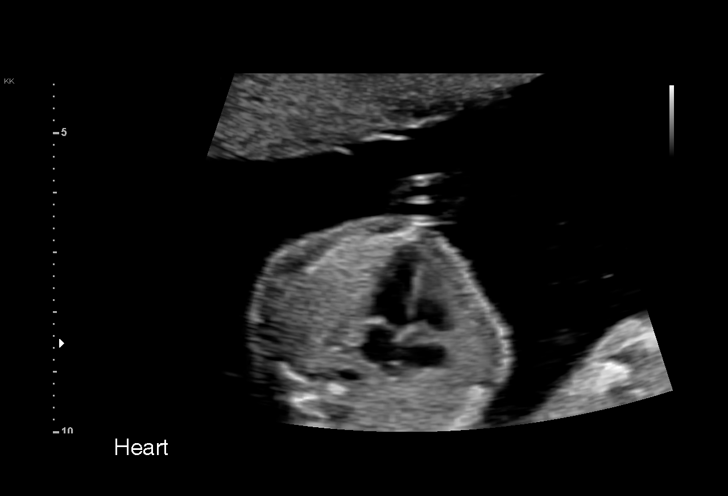
[im 33/177]
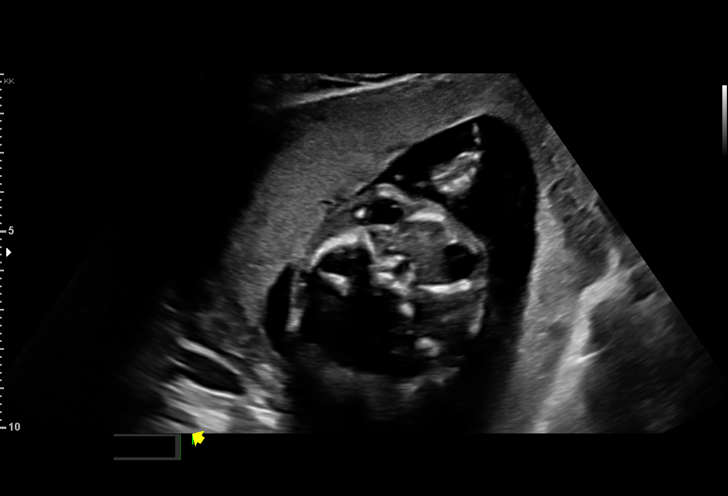
[im 46/177]
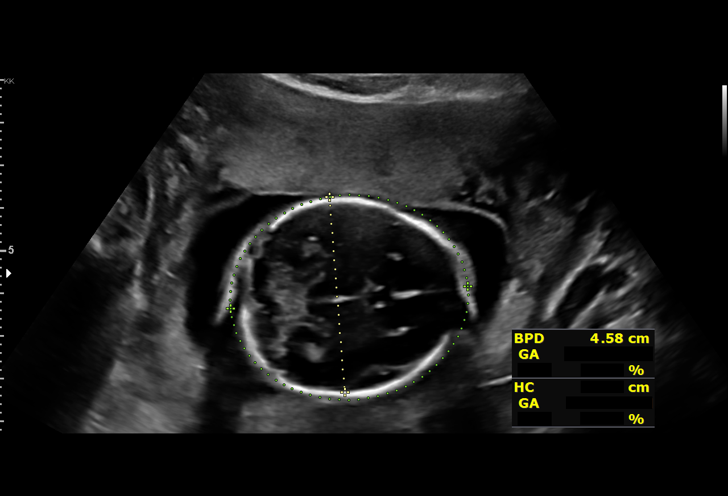
[im 59/177]
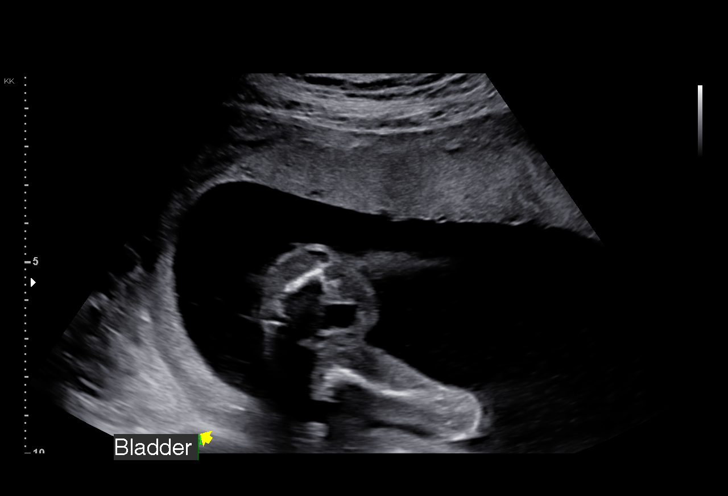
[im 72/177]
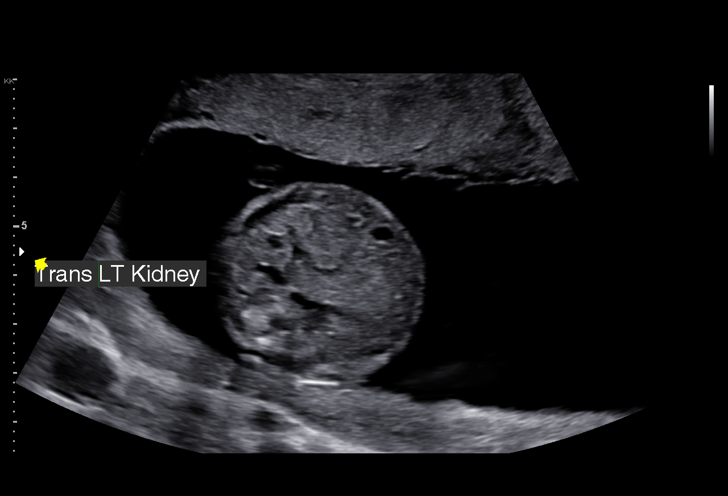
[im 92/177]
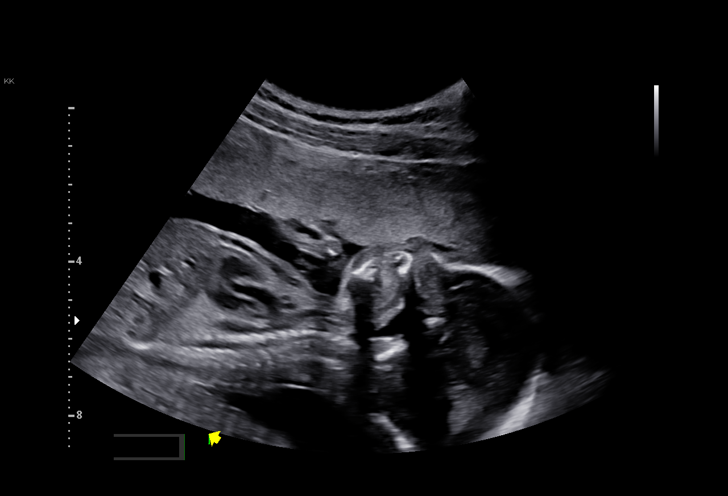
[im 105/177]
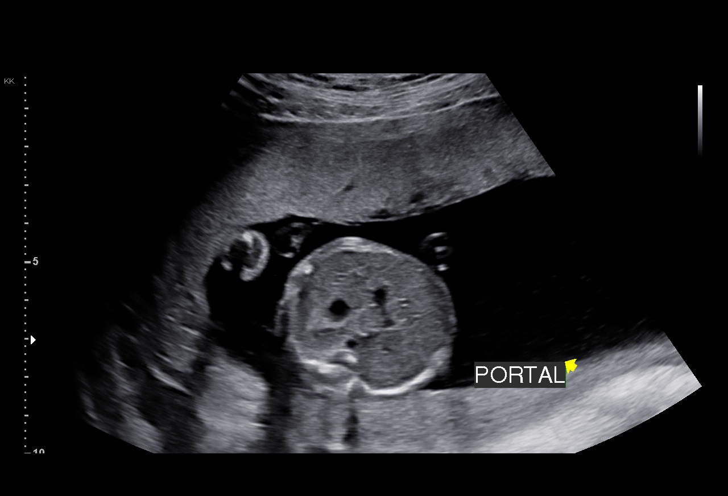
[im 118/177]
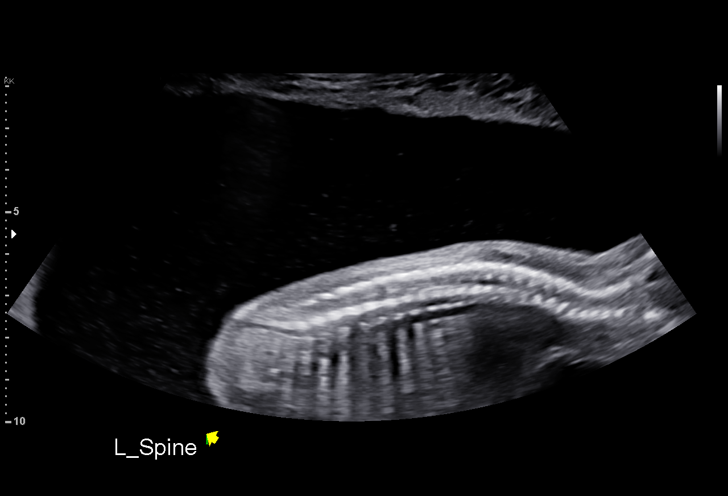
[im 131/177]
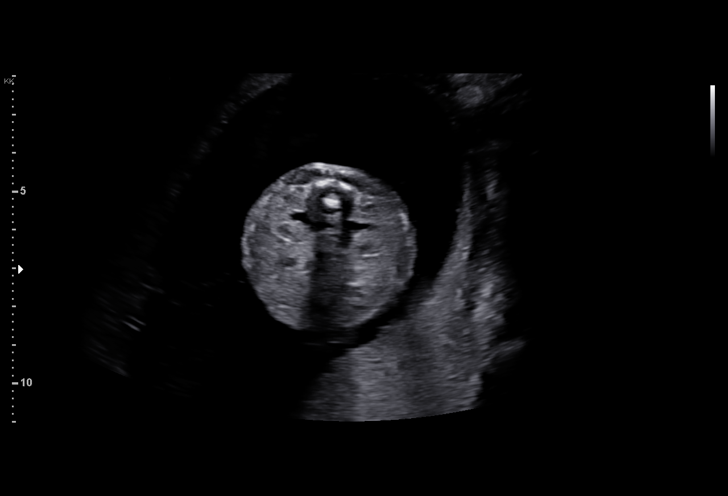
[im 144/177]
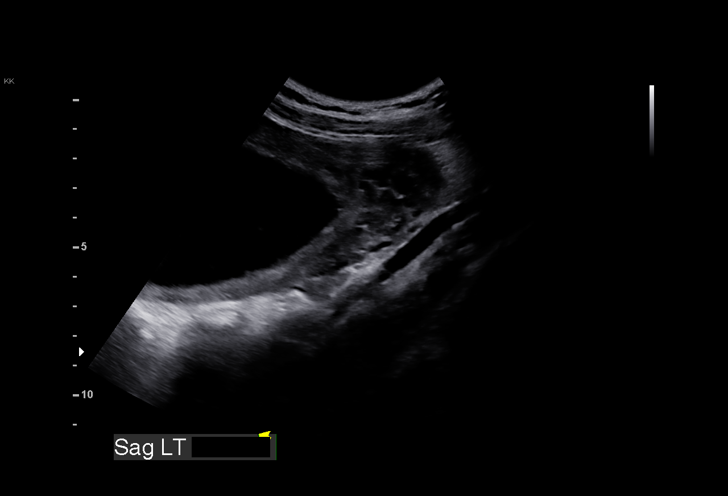
[im 157/177]
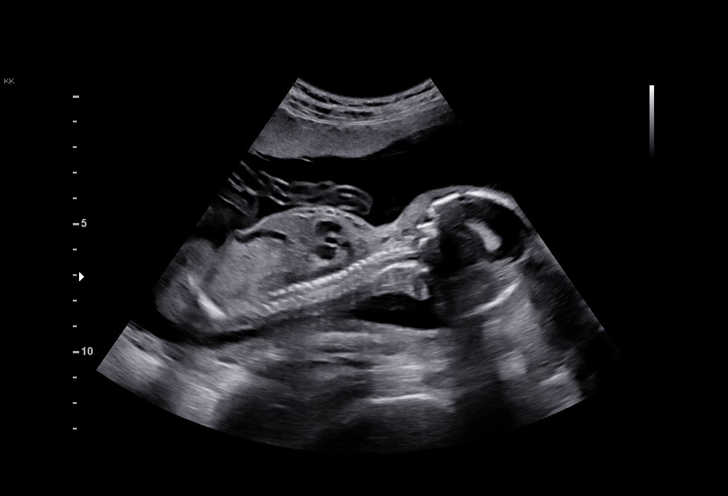
[im 170/177]
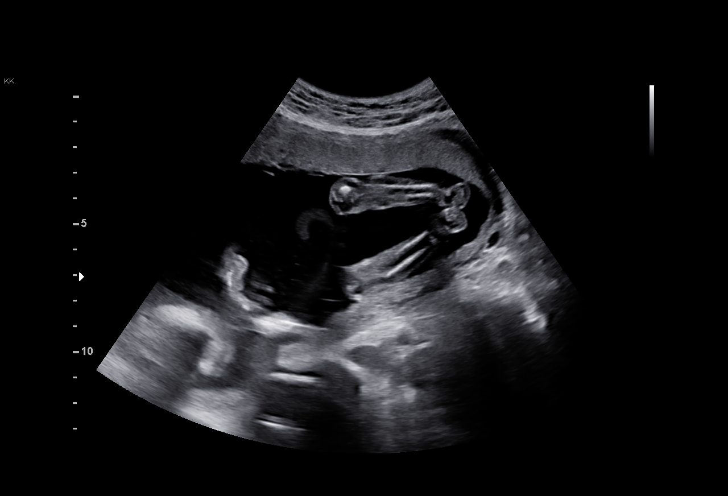

[13 of 28 positions shown; findings below may reference images not displayed]

[id number]

 1  US MFM OB COMP + 14 WK                76805.01    KANESHA MONTER

Indications

 Fetal abnormality - other known or suspected
 Encounter for antenatal screening for
 malformations
 19 weeks gestation of pregnancy
Fetal Evaluation

 Num Of Fetuses:         1
 Fetal Heart Rate(bpm):  141
 Cardiac Activity:       Observed
 Presentation:           Cephalic
 Placenta:               Anterior
 P. Cord Insertion:      Visualized

 Amniotic Fluid
 AFI FV:      Within normal limits

                             Largest Pocket(cm)

Biometry
 BPD:        46  mm     G. Age:  19w 6d         76  %    CI:        77.84   %    70 - 86
                                                         FL/HC:      18.6   %    16.1 -
 HC:       165   mm     G. Age:  19w 2d         39  %    HC/AC:      1.14        1.09 -
 AC:      144.2  mm     G. Age:  19w 5d         61  %    FL/BPD:     66.7   %
 FL:       30.7  mm     G. Age:  19w 4d         51  %    FL/AC:      21.3   %    20 - 24
 CER:      20.7  mm     G. Age:  19w 6d         71  %
 NFT:       4.7  mm
 LV:        5.7  mm
 CM:        3.1  mm
 Est. FW:     302  gm    0 lb 11 oz      65  %
OB History

 Gravidity:    2         Term:   0        Prem:   0        SAB:   1
 TOP:          0       Ectopic:  0        Living: 0
Gestational Age

 LMP:           19w 2d        Date:  09/17/20                 EDD:   06/24/21
 U/S Today:     19w 4d                                        EDD:   06/22/21
 Best:          19w 2d     Det. By:  LMP  (09/17/20)          EDD:   06/24/21
Anatomy

 Cranium:               Appears normal         LVOT:                   Appears normal
 Cavum:                 Appears normal         Aortic Arch:            Appears normal
 Ventricles:            Appears normal         Ductal Arch:            Appears normal
 Choroid Plexus:        Appears normal         Diaphragm:              Appears normal
 Cerebellum:            Appears normal         Stomach:                Appears normal, left
                                                                       sided
 Posterior Fossa:       Appears normal         Abdomen:                Appears normal
 Nuchal Fold:           Appears normal         Abdominal Wall:         Appears nml (cord
                                                                       insert, abd wall)
 Face:                  Appears normal         Cord Vessels:           Appears normal (3
                        (orbits and profile)                           vessel cord)
 Lips:                  Appears normal         Kidneys:                Appear normal
 Palate:                Appears normal         Bladder:                Appears normal
 Thoracic:              Appears normal         Spine:                  Appears normal
 Heart:                 Appears normal         Upper Extremities:      Appears normal
                        (4CH, axis, and
                        situs)
 RVOT:                  Appears normal         Lower Extremities:      Appears normal

 Other:  Heels visualized. Hands visualized. Technically difficult due to fetal
         position.
Targeted Anatomy

 Thorax
 3 Vessel View:         Appears normal         IVC:                    Appears normal
 3 V Trachea View:      Appears normal
Cervix Uterus Adnexa
 Cervix
 Length:           3.26  cm.
 Normal appearance by transabdominal scan.

 Adnexa
 No abnormality visualized.
Comments

 This patient was seen for a detailed fetal anatomy scan.
 She denies any significant past medical history and denies
 any problems in her current pregnancy.
 She has declined all screening tests for fetal aneuploidy in
 her current pregnancy.
 She was informed that the fetal growth and amniotic fluid
 level were appropriate for her gestational age.
 There were no obvious fetal anomalies noted on today's
 ultrasound exam.
 The patient was informed that anomalies may be missed due
 to technical limitations. If the fetus is in a suboptimal position
 or maternal habitus is increased, visualization of the fetus in
 the maternal uterus may be impaired.
 Follow up as indicated.
 All conversations were held with the patient today with the
 help of a French interpreter.

## 2021-10-16 DIAGNOSIS — Z419 Encounter for procedure for purposes other than remedying health state, unspecified: Secondary | ICD-10-CM | POA: Diagnosis not present

## 2021-11-16 DIAGNOSIS — Z419 Encounter for procedure for purposes other than remedying health state, unspecified: Secondary | ICD-10-CM | POA: Diagnosis not present

## 2021-12-04 ENCOUNTER — Ambulatory Visit (INDEPENDENT_AMBULATORY_CARE_PROVIDER_SITE_OTHER): Payer: Medicaid Other

## 2021-12-04 DIAGNOSIS — Z3042 Encounter for surveillance of injectable contraceptive: Secondary | ICD-10-CM | POA: Diagnosis not present

## 2021-12-04 NOTE — Progress Notes (Signed)
Pt is in the office for depo injection, administered in LD and pt tolerated well. Next due Sept 4- 18. .. Administrations This Visit     medroxyPROGESTERone (DEPO-PROVERA) injection 150 mg     Admin Date 12/04/2021 Action Given Dose 150 mg Route Intramuscular Administered By Katrina Stack, RN

## 2021-12-16 DIAGNOSIS — Z419 Encounter for procedure for purposes other than remedying health state, unspecified: Secondary | ICD-10-CM | POA: Diagnosis not present

## 2022-01-16 DIAGNOSIS — Z419 Encounter for procedure for purposes other than remedying health state, unspecified: Secondary | ICD-10-CM | POA: Diagnosis not present

## 2022-02-04 DIAGNOSIS — H5213 Myopia, bilateral: Secondary | ICD-10-CM | POA: Diagnosis not present

## 2022-02-16 DIAGNOSIS — Z419 Encounter for procedure for purposes other than remedying health state, unspecified: Secondary | ICD-10-CM | POA: Diagnosis not present

## 2022-03-18 DIAGNOSIS — Z419 Encounter for procedure for purposes other than remedying health state, unspecified: Secondary | ICD-10-CM | POA: Diagnosis not present

## 2022-04-18 DIAGNOSIS — Z419 Encounter for procedure for purposes other than remedying health state, unspecified: Secondary | ICD-10-CM | POA: Diagnosis not present

## 2022-04-19 ENCOUNTER — Ambulatory Visit: Payer: Medicaid Other

## 2022-05-18 DIAGNOSIS — Z419 Encounter for procedure for purposes other than remedying health state, unspecified: Secondary | ICD-10-CM | POA: Diagnosis not present

## 2022-06-18 DIAGNOSIS — Z419 Encounter for procedure for purposes other than remedying health state, unspecified: Secondary | ICD-10-CM | POA: Diagnosis not present

## 2022-06-18 NOTE — L&D Delivery Note (Signed)
OB/GYN Faculty Practice Delivery Note  Linda Caldwell is a 27 y.o. G3P1011 s/p SVD at [redacted]w[redacted]d. She was admitted for IOL for DFM.   ROM: 10h 86m with clear fluid GBS Status:  Negative/-- (10/15 1508) Maximum Maternal Temperature: 99.78F  Labor Progress: Initial SVE: 3/50/-2. Augmentation with AROM. Initially desire waterbirth but opted for epidural in active labor. She then progressed to complete @0551 .   Delivery Date/Time: 05/01/2023 @0610   Delivery: Called to room and patient was complete and pushing. Head delivered LOA. No nuchal cord present. Shoulder and body delivered in usual fashion. Infant with spontaneous cry, placed on mother's abdomen, dried and stimulated. Cord clamped x 2 after 1-minute delay, and cut by FOB. Cord blood drawn. Placenta delivered spontaneously with gentle cord traction. Fundus firm with massage and Pitocin. Labia, perineum, vagina, and cervix inspected with hemostatic vaginal abrasion.   Baby Weight: 3300g  Placenta: 3 vessel, intact. Sent to L&D Complications: None Lacerations: None EBL: 113 mL Analgesia: Epidural   Infant:  APGAR (1 MIN): 8  APGAR (5 MINS): 9   Wyn Forster, MD OB Family Medicine Fellow, [facility], [facility] 05/01/2023, 7:32 AM

## 2022-07-19 DIAGNOSIS — Z419 Encounter for procedure for purposes other than remedying health state, unspecified: Secondary | ICD-10-CM | POA: Diagnosis not present

## 2022-08-17 DIAGNOSIS — Z419 Encounter for procedure for purposes other than remedying health state, unspecified: Secondary | ICD-10-CM | POA: Diagnosis not present

## 2022-09-05 ENCOUNTER — Ambulatory Visit (INDEPENDENT_AMBULATORY_CARE_PROVIDER_SITE_OTHER): Payer: Medicaid Other

## 2022-09-05 VITALS — BP 104/61 | HR 80 | Wt 129.0 lb

## 2022-09-05 DIAGNOSIS — N912 Amenorrhea, unspecified: Secondary | ICD-10-CM

## 2022-09-05 LAB — POCT URINE PREGNANCY: Preg Test, Ur: POSITIVE — AB

## 2022-09-05 NOTE — Progress Notes (Signed)
Linda Caldwell presents today for UPT. She has no unusual complaints. LMP:07/23/22    OBJECTIVE: Appears well, in no apparent distress.  OB History     Gravida  2   Para  1   Term  1   Preterm      AB  1   Living  1      SAB  1   IAB      Ectopic      Multiple  1   Live Births  1          Home UPT Result: Positive In-Office UPT result:Positive I have reviewed the patient's medical, obstetrical, social, and family histories, and medications.   ASSESSMENT: Positive pregnancy test  PLAN Prenatal care to be completed at: [facility]

## 2022-09-11 ENCOUNTER — Other Ambulatory Visit (HOSPITAL_COMMUNITY): Admission: RE | Admit: 2022-09-11 | Payer: Medicaid Other | Source: Ambulatory Visit

## 2022-09-11 ENCOUNTER — Inpatient Hospital Stay (HOSPITAL_COMMUNITY)
Admission: AD | Admit: 2022-09-11 | Discharge: 2022-09-11 | Disposition: A | Discharge status: Home / Self Care | Payer: Medicaid Other | Attending: Obstetrics & Gynecology | Admitting: Obstetrics & Gynecology

## 2022-09-11 ENCOUNTER — Encounter (HOSPITAL_COMMUNITY): Payer: Self-pay

## 2022-09-11 ENCOUNTER — Inpatient Hospital Stay (HOSPITAL_COMMUNITY): Payer: Medicaid Other

## 2022-09-11 DIAGNOSIS — Z3490 Encounter for supervision of normal pregnancy, unspecified, unspecified trimester: Secondary | ICD-10-CM

## 2022-09-11 DIAGNOSIS — O219 Vomiting of pregnancy, unspecified: Secondary | ICD-10-CM | POA: Insufficient documentation

## 2022-09-11 DIAGNOSIS — O26891 Other specified pregnancy related conditions, first trimester: Secondary | ICD-10-CM | POA: Diagnosis not present

## 2022-09-11 DIAGNOSIS — Z3A01 Less than 8 weeks gestation of pregnancy: Secondary | ICD-10-CM

## 2022-09-11 DIAGNOSIS — R102 Pelvic and perineal pain: Secondary | ICD-10-CM | POA: Diagnosis not present

## 2022-09-11 LAB — CBC
HCT: 36.2 % (ref 36.0–46.0)
Hemoglobin: 12.7 g/dL (ref 12.0–15.0)
MCH: 32.3 pg (ref 26.0–34.0)
MCHC: 35.1 g/dL (ref 30.0–36.0)
MCV: 92.1 fL (ref 80.0–100.0)
Platelets: 255 10*3/uL (ref 150–400)
RBC: 3.93 MIL/uL (ref 3.87–5.11)
RDW: 11.9 % (ref 11.5–15.5)
WBC: 5.6 10*3/uL (ref 4.0–10.5)
nRBC: 0 % (ref 0.0–0.2)

## 2022-09-11 LAB — URINALYSIS, ROUTINE W REFLEX MICROSCOPIC
Glucose, UA: NEGATIVE mg/dL
Hgb urine dipstick: NEGATIVE
Ketones, ur: 20 mg/dL — AB
Nitrite: NEGATIVE
Protein, ur: 30 mg/dL — AB
Specific Gravity, Urine: 1.021 (ref 1.005–1.030)
pH: 7 (ref 5.0–8.0)

## 2022-09-11 LAB — WET PREP, GENITAL
Clue Cells Wet Prep HPF POC: NONE SEEN
Sperm: NONE SEEN
Trich, Wet Prep: NONE SEEN
WBC, Wet Prep HPF POC: >=10 — AB (ref <10)

## 2022-09-11 LAB — HCG, QUANTITATIVE, PREGNANCY: hCG, Beta Chain, Quant, S: 95645 m[IU]/mL — ABNORMAL HIGH (ref <5)

## 2022-09-11 MED ORDER — PROMETHAZINE HCL 25 MG PO TABS: 25.0000 mg | ORAL_TABLET | Freq: Four times a day (QID) | ORAL | 0 refills | Status: DC | PRN

## 2022-09-11 NOTE — MAU Provider Note (Signed)
History     CSN: [id number]  Arrival date and time: 09/11/22 1349   None     Chief Complaint  Patient presents with   Abdominal Pain   Emesis   Nausea   Shortness of Breath   Linda Caldwell is a 27 y.o. G3P1011 at [redacted]w[redacted]d who presents today with abdominal pain and nausea.   Emesis  This is a new problem. The current episode started 1 to 4 weeks ago. The problem occurs less than 2 times per day. The problem has been unchanged. The emesis has an appearance of stomach contents. There has been no fever. Risk factors: pregnancy.  Pelvic Pain The patient's primary symptoms include pelvic pain. This is a new problem. The current episode started in the past 7 days. The problem occurs intermittently. The problem has been unchanged. The pain is mild. The problem affects both sides. She is pregnant. The vaginal discharge was normal. There has been no bleeding. Nothing aggravates the symptoms. She has tried nothing for the symptoms.    OB History     Gravida  3   Para  1   Term  1   Preterm      AB  1   Living  1      SAB  1   IAB      Ectopic      Multiple  1   Live Births  1           Past Medical History:  Diagnosis Date   Medical history non-contributory     Past Surgical History:  Procedure Laterality Date   NO PAST SURGERIES      Family History  Problem Relation Age of Onset   Hyperlipidemia Maternal Aunt     Social History   Tobacco Use   Smoking status: Never   Smokeless tobacco: Never  Vaping Use   Vaping Use: Never used  Substance Use Topics   Alcohol use: Not Currently   Drug use: Never    Allergies: No Known Allergies  Facility-Administered Medications Prior to Admission  Medication Dose Route Frequency Provider Last Rate Last Admin   medroxyPROGESTERone (DEPO-PROVERA) injection 150 mg  150 mg Intramuscular Q90 days Constant, Peggy, MD   150 mg at 12/04/21 1545   Medications Prior to Admission  Medication Sig Dispense Refill  Last Dose   acetaminophen (TYLENOL) 500 MG tablet Take 500 mg by mouth every 6 (six) hours as needed for headache, fever or moderate pain. (Patient not taking: Reported on 06/23/2021)      ibuprofen (ADVIL) 600 MG tablet Take 1 tablet (600 mg total) by mouth every 6 (six) hours. (Patient not taking: Reported on 06/23/2021) 30 tablet 0    medroxyPROGESTERone (DEPO-PROVERA) 150 MG/ML injection Inject 1 mL (150 mg total) into the muscle every 3 (three) months. (Patient not taking: Reported on 09/05/2022) 1 mL 5    Prenatal Vit-Fe Phos-FA-Omega (VITAFOL GUMMIES) 3.33-0.333-34.8 MG CHEW Chew 3 tablets by mouth daily before breakfast. (Patient not taking: Reported on 06/16/2021) 90 tablet 11     Review of Systems  Genitourinary:  Positive for pelvic pain.  All other systems reviewed and are negative.  Physical Exam   Blood pressure 109/68, temperature 98.5 F (36.9 C), temperature source Oral, resp. rate 15, weight 58.2 kg, last menstrual period 07/20/2022, SpO2 100 %, not currently breastfeeding.  Physical Exam Vitals and nursing note reviewed.  Constitutional:      General: She is not in acute distress. HENT:  Head: Normocephalic.  Eyes:     Pupils: Pupils are equal, round, and reactive to light.  Pulmonary:     Effort: Pulmonary effort is normal.  Abdominal:     Palpations: Abdomen is soft.  Musculoskeletal:        General: Normal range of motion.  Skin:    General: Skin is warm and dry.  Neurological:     Mental Status: She is alert and oriented to person, place, and time.  Psychiatric:        Mood and Affect: Mood normal.        Behavior: Behavior normal.     Results for orders placed or performed during the hospital encounter of 09/11/22 (from the past 24 hour(s))  Urinalysis, Routine w reflex microscopic -Urine, Clean Catch     Status: Abnormal   Collection Time: 09/11/22  2:22 PM  Result Value Ref Range   Color, Urine YELLOW YELLOW   APPearance HAZY (A) CLEAR   Specific  Gravity, Urine 1.021 1.005 - 1.030   pH 7.0 5.0 - 8.0   Glucose, UA NEGATIVE NEGATIVE mg/dL   Hgb urine dipstick NEGATIVE NEGATIVE   Bilirubin Urine SMALL (A) NEGATIVE   Ketones, ur 20 (A) NEGATIVE mg/dL   Protein, ur 30 (A) NEGATIVE mg/dL   Nitrite NEGATIVE NEGATIVE   Leukocytes,Ua SMALL (A) NEGATIVE   RBC / HPF 0-5 0 - 5 RBC/hpf   WBC, UA 21-50 0 - 5 WBC/hpf   Bacteria, UA FEW (A) NONE SEEN   Squamous Epithelial / HPF 6-10 0 - 5 /HPF   Mucus PRESENT    Budding Yeast PRESENT   Wet prep, genital     Status: Abnormal   Collection Time: 09/11/22  2:26 PM  Result Value Ref Range   Yeast Wet Prep HPF POC PRESENT (A) NONE SEEN   Trich, Wet Prep NONE SEEN NONE SEEN   Clue Cells Wet Prep HPF POC NONE SEEN NONE SEEN   WBC, Wet Prep HPF POC >=10 (A) <10   Sperm NONE SEEN   CBC     Status: None   Collection Time: 09/11/22  3:02 PM  Result Value Ref Range   WBC 5.6 4.0 - 10.5 K/uL   RBC 3.93 3.87 - 5.11 MIL/uL   Hemoglobin 12.7 12.0 - 15.0 g/dL   HCT 36.2 36.0 - 46.0 %   MCV 92.1 80.0 - 100.0 fL   MCH 32.3 26.0 - 34.0 pg   MCHC 35.1 30.0 - 36.0 g/dL   RDW 11.9 11.5 - 15.5 %   Platelets 255 150 - 400 K/uL   nRBC 0.0 0.0 - 0.2 %   US OB LESS THAN 14 WEEKS WITH OB TRANSVAGINAL  Result Date: 09/11/2022 CLINICAL DATA:  Abdominal pain.  Pregnant EXAM: OBSTETRIC <14 WK [place] AND TRANSVAGINAL OB US DOPPLER ULTRASOUND OF OVARIES TECHNIQUE: Both transabdominal and transvaginal ultrasound examinations were performed for complete evaluation of the gestation as well as the maternal uterus, adnexal regions, and pelvic cul-de-sac. Transvaginal technique was performed to assess early pregnancy. Color and duplex Doppler ultrasound was utilized to evaluate blood flow to the ovaries. COMPARISON:  None Available. FINDINGS: Intrauterine gestational sac: Single Yolk sac:  Visualized. Embryo:  Visualized. Cardiac Activity: Yes Heart Rate: 139 bpm CRL:   8.7 mm   6 w 6 d                  [place] EDC: 05/01/2023  Subchorionic hemorrhage: Small heterogeneous area identified consistent with a subchorionic  hemorrhage measuring 16 x 12 by 15 mm. Maternal uterus/adnexae: The left ovary measures 2.3 x 1.6 x 2.2 cm and right 3.3 x 2.5 x 2.4 cm. Multiple follicles identified. There is blood flow on Doppler. There is a slightly heterogeneous area in the right ovary measuring 19 mm. Possible complex hemorrhagic cysts. Pulsed Doppler evaluation of both ovaries demonstrates normal appearing low-resistance arterial and venous waveforms. IMPRESSION: Single live intrauterine pregnancy of six weeks and six days with positive fetal heart motion. Small subchorionic hemorrhage. Electronically Signed   By: Jill Side M.D.   On: 09/11/2022 16:15     MAU Course  Procedures  MDM   Assessment and Plan   1. Pelvic pain in pregnancy, antepartum, first trimester   2. [redacted] weeks gestation of pregnancy   3. Intrauterine pregnancy    DC home in stable condition  Comfort measures reviewed  1st Trimester precautions  RX: phenergan 25mg  PRN #30  Return to MAU as needed FU with OB as planned   Follow-up Information     [facility] Follow up.   Why: As scheduled Contact information: [address] [phone] [place] DNP, [place]  09/11/22  5:03 PM   Int# [id number] used for entire encounter

## 2022-09-11 NOTE — Discharge Instructions (Signed)

## 2022-09-11 NOTE — MAU Note (Addendum)
.  Linda Caldwell is a 27 y.o. at [redacted]w[redacted]d here in MAU reporting: for x3 days she has been having lower abdominal cramping and some nausea and vomiting. She also reports shortness of breath.  Lmp: 07/20/22 Pain score: 9 Vitals:   09/11/22 1405  BP: 109/68  Resp: 15  SpO2: 100%     FHT: n/a Lab orders placed from triage:  UA

## 2022-09-12 LAB — GC/CHLAMYDIA PROBE AMP (~~LOC~~) NOT AT ARMC
Chlamydia: NEGATIVE
Comment: NEGATIVE
Comment: NORMAL
Neisseria Gonorrhea: NEGATIVE

## 2022-09-13 ENCOUNTER — Other Ambulatory Visit: Payer: Self-pay | Admitting: Advanced Practice Midwife

## 2022-09-13 LAB — CULTURE, OB URINE: Culture: >=100000 — AB

## 2022-09-13 MED ORDER — CEFADROXIL 500 MG PO CAPS: 500.0000 mg | ORAL_CAPSULE | Freq: Two times a day (BID) | ORAL | 0 refills | Status: AC

## 2022-09-17 DIAGNOSIS — Z419 Encounter for procedure for purposes other than remedying health state, unspecified: Secondary | ICD-10-CM | POA: Diagnosis not present

## 2022-09-19 ENCOUNTER — Ambulatory Visit (INDEPENDENT_AMBULATORY_CARE_PROVIDER_SITE_OTHER): Payer: Medicaid Other | Admitting: *Deleted

## 2022-09-19 VITALS — BP 99/60 | HR 74 | Ht 62.5 in | Wt 127.6 lb

## 2022-09-19 DIAGNOSIS — O219 Vomiting of pregnancy, unspecified: Secondary | ICD-10-CM

## 2022-09-19 DIAGNOSIS — Z1339 Encounter for screening examination for other mental health and behavioral disorders: Secondary | ICD-10-CM | POA: Diagnosis not present

## 2022-09-19 DIAGNOSIS — Z3481 Encounter for supervision of other normal pregnancy, first trimester: Secondary | ICD-10-CM

## 2022-09-19 DIAGNOSIS — Z3480 Encounter for supervision of other normal pregnancy, unspecified trimester: Secondary | ICD-10-CM | POA: Diagnosis not present

## 2022-09-19 DIAGNOSIS — Z3A08 8 weeks gestation of pregnancy: Secondary | ICD-10-CM

## 2022-09-19 MED ORDER — VITAFOL GUMMIES 3.33-0.333-34.8 MG PO CHEW: 3.0000 | CHEWABLE_TABLET | Freq: Every day | ORAL | 11 refills | Status: DC

## 2022-09-19 NOTE — Progress Notes (Signed)
New OB Intake A [place] tele interpreter was used for the entire visit.  I connected with Linda Caldwell  on 09/19/22 at  1:10 PM EDT by In Person Video Visit and verified that I am speaking with the correct person using two identifiers. Nurse is located at [facility] and pt is located at [place].  I discussed the limitations, risks, security and privacy concerns of performing an evaluation and management service by telephone and the availability of in person appointments. I also discussed with the patient that there may be a patient responsible charge related to this service. The patient expressed understanding and agreed to proceed.  I explained I am completing New OB Intake today. We discussed EDD of 04/26/23 that is based on LMP of 07/20/22. Pt is G3/P1. I reviewed her allergies, medications, Medical/Surgical/OB history, and appropriate screenings. I informed her of [facility] services. [facility] information placed in AVS. Based on history, this is a low risk pregnancy.  Patient Active Problem List   Diagnosis Date Noted   Postpartum fever 06/19/2021   Labor and delivery indication for care or intervention 06/17/2021   SVD (spontaneous vaginal delivery) 06/17/2021   Malaria 06/06/2021   High risk medication use 06/06/2021   Pregnancy 06/06/2021   Malaria during pregnancy, third trimester 05/19/2021   Insufficient prenatal care in third trimester 05/18/2021   Language barrier 12/08/2020   Supervision of normal pregnancy, antepartum 11/23/2020    Concerns addressed today  Delivery Plans Plans to deliver at [facility]. Patient given information for [facility] Healthy Baby website for more information about [facility] and [place]. Patient is interested in water birth. Offered upcoming OB visit with CNM to discuss further.  MyChart/Babyscripts MyChart access verified. I explained pt will have some visits in office and some virtually. Babyscripts instructions given and order placed. Patient verifies  receipt of registration text/e-mail. Account successfully created and app downloaded.  Blood Pressure Cuff/Weight Scale Blood pressure cuff ordered for patient to pick-up from [employer]. Explained after first prenatal appt pt will check weekly and document in 61. Patient does not have weight scale; patient may purchase if they desire to track weight weekly in Babyscripts.  Anatomy US Explained first scheduled [place] will be around 19 weeks. Anatomy US scheduled for 19 wks at MFM. Pt notified to arrive at [facility].  Labs Discussed Johnsie Cancel genetic screening with patient. Would like both Panorama and Horizon drawn at new OB visit. Routine prenatal labs needed.  COVID Vaccine Patient has not had COVID vaccine.   Social Determinants of Health Food Insecurity: Patient denies food insecurity. WIC Referral: Patient is interested in referral to [facility].  Transportation: Patient denies transportation needs. Childcare: Discussed no children allowed at ultrasound appointments. Offered childcare services; patient declines childcare services at this time.  Interested in [place]? If yes, send referral and doula dot phrase.   First visit review I reviewed new OB appt with patient. I explained they will have a provider visit that includes discussion and genetic screening. Explained pt will be seen by Dr. Jodi Mourning at first visit; encounter routed to appropriate provider. Explained that patient will be seen by pregnancy navigator following visit with provider.   Linda Pia, RN 09/19/2022  1:15 PM

## 2022-09-19 NOTE — Progress Notes (Signed)
Pt reports unable to eat very much due to nausea and food aversions. Reports similar problem and weight loss in previous pregnancy. Discussed need for hydration, small frequent meals and including protein in diet. Pt reports this will be very difficult. Has RX phenergan that "helps some." Discussed with Dr. Jodi Mourning. Boost requested thru [facility] (twice a day with meals-9 months). [place] form completed and faxed to [facility] office [place].

## 2022-09-20 LAB — CBC/D/PLT+RPR+RH+ABO+RUBIGG...
Antibody Screen: NEGATIVE
Basophils Absolute: 0 10*3/uL (ref 0.0–0.2)
Basos: 1 %
EOS (ABSOLUTE): 0.1 10*3/uL (ref 0.0–0.4)
Eos: 2 %
HCV Ab: NONREACTIVE
HIV Screen 4th Generation wRfx: NONREACTIVE
Hematocrit: 36.2 % (ref 34.0–46.6)
Hemoglobin: 12.1 g/dL (ref 11.1–15.9)
Hepatitis B Surface Ag: NEGATIVE
Immature Grans (Abs): 0 10*3/uL (ref 0.0–0.1)
Immature Granulocytes: 0 %
Lymphocytes Absolute: 2.2 10*3/uL (ref 0.7–3.1)
Lymphs: 42 %
MCH: 32.5 pg (ref 26.6–33.0)
MCHC: 33.4 g/dL (ref 31.5–35.7)
MCV: 97 fL (ref 79–97)
Monocytes Absolute: 0.3 10*3/uL (ref 0.1–0.9)
Monocytes: 6 %
Neutrophils Absolute: 2.5 10*3/uL (ref 1.4–7.0)
Neutrophils: 49 %
Platelets: 245 10*3/uL (ref 150–450)
RBC: 3.72 x10E6/uL — ABNORMAL LOW (ref 3.77–5.28)
RDW: 12 % (ref 11.7–15.4)
RPR Ser Ql: NONREACTIVE
Rh Factor: POSITIVE
Rubella Antibodies, IGG: 20.8 index (ref >0.99)
WBC: 5.1 10*3/uL (ref 3.4–10.8)

## 2022-09-20 LAB — HCV INTERPRETATION

## 2022-10-16 ENCOUNTER — Encounter: Payer: Self-pay | Admitting: Certified Nurse Midwife

## 2022-10-16 ENCOUNTER — Ambulatory Visit (INDEPENDENT_AMBULATORY_CARE_PROVIDER_SITE_OTHER): Payer: Medicaid Other | Admitting: Certified Nurse Midwife

## 2022-10-16 VITALS — BP 119/63 | HR 82 | Wt 125.4 lb

## 2022-10-16 DIAGNOSIS — O99013 Anemia complicating pregnancy, third trimester: Secondary | ICD-10-CM | POA: Diagnosis not present

## 2022-10-16 DIAGNOSIS — Z758 Other problems related to medical facilities and other health care: Secondary | ICD-10-CM | POA: Diagnosis not present

## 2022-10-16 DIAGNOSIS — Z3480 Encounter for supervision of other normal pregnancy, unspecified trimester: Secondary | ICD-10-CM | POA: Diagnosis not present

## 2022-10-16 DIAGNOSIS — Z3A28 28 weeks gestation of pregnancy: Secondary | ICD-10-CM | POA: Diagnosis not present

## 2022-10-16 DIAGNOSIS — Z3492 Encounter for supervision of normal pregnancy, unspecified, second trimester: Secondary | ICD-10-CM | POA: Diagnosis not present

## 2022-10-16 DIAGNOSIS — O368130 Decreased fetal movements, third trimester, not applicable or unspecified: Secondary | ICD-10-CM | POA: Diagnosis not present

## 2022-10-16 DIAGNOSIS — Z3A40 40 weeks gestation of pregnancy: Secondary | ICD-10-CM | POA: Diagnosis not present

## 2022-10-16 DIAGNOSIS — Z603 Acculturation difficulty: Secondary | ICD-10-CM | POA: Diagnosis not present

## 2022-10-16 DIAGNOSIS — Z31430 Encounter of female for testing for genetic disease carrier status for procreative management: Secondary | ICD-10-CM | POA: Diagnosis not present

## 2022-10-16 DIAGNOSIS — Z3483 Encounter for supervision of other normal pregnancy, third trimester: Secondary | ICD-10-CM | POA: Diagnosis not present

## 2022-10-16 DIAGNOSIS — Z8613 Personal history of malaria: Secondary | ICD-10-CM

## 2022-10-16 DIAGNOSIS — Z3A12 12 weeks gestation of pregnancy: Secondary | ICD-10-CM

## 2022-10-16 DIAGNOSIS — Z3481 Encounter for supervision of other normal pregnancy, first trimester: Secondary | ICD-10-CM

## 2022-10-16 DIAGNOSIS — O26892 Other specified pregnancy related conditions, second trimester: Secondary | ICD-10-CM | POA: Diagnosis not present

## 2022-10-16 DIAGNOSIS — Z363 Encounter for antenatal screening for malformations: Secondary | ICD-10-CM | POA: Diagnosis not present

## 2022-10-16 NOTE — Progress Notes (Signed)
History:   Linda Caldwell is a 27 y.o. G3P1011 at [redacted]w[redacted]d by {Ob dating:14516} being seen today for her first obstetrical visit.  Her obstetrical history is significant for {ob risk factors:10154}. Patient {does/does not:19097} intend to breast feed. Pregnancy history fully reviewed.  Patient reports {sx:14538}.      HISTORY: OB History  Gravida Para Term Preterm AB Living  3 1 1  0 1 1  SAB IAB Ectopic Multiple Live Births  1 0 0 0 1    # Outcome Date GA Lbr Len/2nd Weight Sex Delivery Anes PTL Lv  3 Current           2 Term 06/17/21 [redacted]w[redacted]d 14:23 / 00:44 7 lb 1 oz (3.204 kg) F Vag-Spont EPI  LIV     Name: Withey,GIRL Amal     Apgar1: 9  Apgar5: 9  1 SAB 2018     SAB       Last pap smear was done *** and was {Normal/Abnormal Appearance:21344::"normal"}  Past Medical History:  Diagnosis Date   Medical history non-contributory    No pertinent past medical history    Past Surgical History:  Procedure Laterality Date   NO PAST SURGERIES     Family History  Problem Relation Age of Onset   Hypertension Paternal Grandfather    Hyperlipidemia Maternal Aunt    Hypertension Paternal Aunt    Social History   Tobacco Use   Smoking status: Never   Smokeless tobacco: Never  Vaping Use   Vaping Use: Never used  Substance Use Topics   Alcohol use: Not Currently   Drug use: Never   No Known Allergies Current Outpatient Medications on File Prior to Visit  Medication Sig Dispense Refill   acetaminophen (TYLENOL) 500 MG tablet Take 500 mg by mouth every 6 (six) hours as needed for headache, fever or moderate pain.     Prenatal Vit-Fe Phos-FA-Omega (VITAFOL GUMMIES) 3.33-0.333-34.8 MG CHEW Chew 3 tablets by mouth daily before breakfast. 90 tablet 11   promethazine (PHENERGAN) 25 MG tablet Take 1 tablet (25 mg total) by mouth every 6 (six) hours as needed for nausea or vomiting. 30 tablet 0   No current facility-administered medications on file prior to visit.    Review  of Systems Pertinent items noted in HPI and remainder of comprehensive ROS otherwise negative.  Indications for ASA therapy (per UpToDate) One of the following: Previous pregnancy with preeclampsia, especially early onset and with an adverse outcome {yes/no:20286} Multifetal gestation {yes/no:20286} Chronic hypertension {yes/no:20286} Type 1 or 2 diabetes mellitus {yes/no:20286} Chronic kidney disease {yes/no:20286} Autoimmune disease (antiphospholipid syndrome, systemic lupus erythematosus) {yes/no:20286} Two or more of the following: Nulliparity {yes/no:20286} Obesity (body mass index >30 kg/m2) {yes/no:20286} Family history of preeclampsia in mother or sister {yes/no:20286} Age ?35 years {yes/no:20286} Sociodemographic characteristics (African American race, low socioeconomic level) {yes/no:20286} Personal risk factors (eg, previous pregnancy with low birth weight or small for gestational age infant, previous adverse pregnancy outcome [eg, stillbirth], interval >10 years between pregnancies) {yes/no:20286}  Indications for early GDM screening (FBS, A1C, Random CBG, GTT) First-degree relative with diabetes {yes/no:20286} BMI >30kg/m2 {yes/no:20286} Age > 35 {yes/no:20286} Previous birth of an infant weighing ?4000 g {yes/no:20286} Gestational diabetes mellitus in a previous pregnancy {yes/no:20286} Glycated hemoglobin ?5.7 percent (39 mmol/mol), impaired glucose tolerance, or impaired fasting glucose on previous testing {yes/no:20286} High-risk race/ethnicity (eg, African American, Latino, Native American, [place] American, [place] Islander) {yes/no:20286} Previous stillbirth of unknown cause {yes/no:20286} Maternal birthweight > 9 lbs {yes/no:20286} History of cardiovascular  disease {yes/no:20286} Hypertension or on therapy for hypertension {yes/no:20286} High-density lipoprotein cholesterol level <35 mg/dL (1.61 mmol/L) and/or a triglyceride level >250 mg/dL (0.96 mmol/L)  {EAV/WU:98119} Polycystic ovary syndrome {yes/no:20286} Physical inactivity {yes/no:20286} Other clinical condition associated with insulin resistance (eg, severe obesity, acanthosis nigricans) {yes/no:20286} Current use of glucocorticoids {yes/no:20286}   Physical Exam:   Vitals:   10/16/22 1511  BP: 119/63  Pulse: 82  Weight: 125 lb 6.4 oz (56.9 kg)   Fetal Heart Rate (bpm): 134  Uterine size:   ***Bedside Ultrasound for FHR check: Viable intrauterine pregnancy with positive cardiac activity noted, fetal heart rate ***bpm Patient informed that the ultrasound is considered a limited obstetric ultrasound and is not intended to be a complete ultrasound exam.  Patient also informed that the ultrasound is not being completed with the intent of assessing for fetal or placental anomalies or any pelvic abnormalities.  Explained that the purpose of today's ultrasound is to assess for fetal heart rate.  Patient acknowledges the purpose of the exam and the limitations of the study. General: well-developed, well-nourished female in no acute distress  Breasts:  normal appearance, no masses or tenderness bilaterally, exam done in the presence of a chaperone.   Skin: normal coloration and turgor, no rashes  Neurologic: oriented, normal, negative, normal mood  Extremities: normal strength, tone, and muscle mass, ROM of all joints is normal  HEENT PERRLA, extraocular movement intact and sclera clear, anicteric  Neck supple and no masses  Cardiovascular: regular rate and rhythm  Respiratory:  no respiratory distress, normal breath sounds  Abdomen: soft, non-tender; bowel sounds normal; no masses,  no organomegaly  Pelvic: normal external genitalia, no lesions, normal vaginal mucosa, normal vaginal discharge, normal cervix, ***pap smear done. Exam done in the presence of a chaperone.     Assessment:    Pregnancy: G3P1011 Patient Active Problem List   Diagnosis Date Noted   Supervision of other  normal pregnancy, antepartum 09/19/2022   Postpartum fever 06/19/2021   Labor and delivery indication for care or intervention 06/17/2021   SVD (spontaneous vaginal delivery) 06/17/2021   Malaria 06/06/2021   High risk medication use 06/06/2021   Pregnancy 06/06/2021   Malaria during pregnancy, third trimester 05/19/2021   Insufficient prenatal care in third trimester 05/18/2021   Language barrier 12/08/2020   Supervision of normal pregnancy, antepartum 11/23/2020     Plan:    1. Encounter for supervision of low-risk pregnancy in second trimester ***  2. [redacted] weeks gestation of pregnancy ***  3. Language barrier ***  4. Encounter for supervision of other normal pregnancy in first trimester *** - Panorama Prenatal Test Full Panel  5. Encounter of female for testing for genetic disease carrier status for procreative management *** - HORIZON Basic Panel   Initial labs drawn. Continue prenatal vitamins. Problem list reviewed and updated. Genetic Screening discussed, {Blank multiple:19196::"Panorama","Horizon"}: {requests/ordered/declines:14581}. Ultrasound discussed; fetal anatomic survey: {Planned/scheduled/na:14534}. Anticipatory guidance about prenatal visits given including labs, ultrasounds, and testing. Weight gain recommendations per IOM guidelines reviewed: underweight/BMI 18.5 or less > 28 - 40 lbs; normal weight/BMI 18.5 - 24.9 > 25 - 35 lbs; overweight/BMI 25 - 29.9 > 15 - 25 lbs; obese/BMI  30 or more > 11 - 20 lbs. Discussed usage of the Babyscripts app for more information about pregnancy, and to track blood pressures. Also discussed usage of virtual visits as additional source of managing and completing prenatal visits.  Patient was encouraged to use MyChart to review results, send requests, and have  questions addressed.   The nature of Center for [facility] with multiple MDs and Advanced Practice Providers was explained to patient; also  emphasized that residents, students are part of our team. Routine obstetric precautions reviewed. Encouraged to seek out care at our office or emergency room [facility] MAU preferred) for urgent and/or emergent concerns. No follow-ups on file.     Jaynie Collins, MD, FACOG Obstetrician & Gynecologist, [facility] [employer], [facility]

## 2022-10-17 DIAGNOSIS — Z419 Encounter for procedure for purposes other than remedying health state, unspecified: Secondary | ICD-10-CM | POA: Diagnosis not present

## 2022-10-24 LAB — HORIZON CUSTOM: REPORT SUMMARY: NEGATIVE

## 2022-10-26 LAB — PANORAMA PRENATAL TEST FULL PANEL:PANORAMA TEST PLUS 5 ADDITIONAL MICRODELETIONS: FETAL FRACTION: 14.4

## 2022-10-31 ENCOUNTER — Inpatient Hospital Stay (HOSPITAL_COMMUNITY)
Admission: AD | Admit: 2022-10-31 | Discharge: 2022-11-01 | Disposition: A | Discharge status: Home / Self Care | Payer: Medicaid Other | Attending: Obstetrics and Gynecology | Admitting: Obstetrics and Gynecology

## 2022-10-31 DIAGNOSIS — O210 Mild hyperemesis gravidarum: Secondary | ICD-10-CM | POA: Insufficient documentation

## 2022-10-31 DIAGNOSIS — O26899 Other specified pregnancy related conditions, unspecified trimester: Secondary | ICD-10-CM

## 2022-10-31 DIAGNOSIS — R109 Unspecified abdominal pain: Secondary | ICD-10-CM | POA: Insufficient documentation

## 2022-10-31 DIAGNOSIS — Z3A14 14 weeks gestation of pregnancy: Secondary | ICD-10-CM

## 2022-10-31 DIAGNOSIS — O26892 Other specified pregnancy related conditions, second trimester: Secondary | ICD-10-CM | POA: Insufficient documentation

## 2022-10-31 DIAGNOSIS — R509 Fever, unspecified: Secondary | ICD-10-CM

## 2022-10-31 DIAGNOSIS — R112 Nausea with vomiting, unspecified: Secondary | ICD-10-CM

## 2022-11-01 DIAGNOSIS — R112 Nausea with vomiting, unspecified: Secondary | ICD-10-CM

## 2022-11-01 DIAGNOSIS — R509 Fever, unspecified: Secondary | ICD-10-CM

## 2022-11-01 DIAGNOSIS — Z3A14 14 weeks gestation of pregnancy: Secondary | ICD-10-CM

## 2022-11-01 DIAGNOSIS — R109 Unspecified abdominal pain: Secondary | ICD-10-CM | POA: Diagnosis not present

## 2022-11-01 DIAGNOSIS — O210 Mild hyperemesis gravidarum: Secondary | ICD-10-CM | POA: Diagnosis not present

## 2022-11-01 DIAGNOSIS — O26892 Other specified pregnancy related conditions, second trimester: Secondary | ICD-10-CM | POA: Diagnosis not present

## 2022-11-01 LAB — COMPREHENSIVE METABOLIC PANEL
ALT: 16 U/L (ref 0–44)
AST: 18 U/L (ref 15–41)
Albumin: 3.6 g/dL (ref 3.5–5.0)
Alkaline Phosphatase: 84 U/L (ref 38–126)
Anion gap: 11 (ref 5–15)
BUN: <5 mg/dL — ABNORMAL LOW (ref 6–20)
CO2: 23 mmol/L (ref 22–32)
Calcium: 9.5 mg/dL (ref 8.9–10.3)
Chloride: 96 mmol/L — ABNORMAL LOW (ref 98–111)
Creatinine, Ser: 0.56 mg/dL (ref 0.44–1.00)
GFR, Estimated: >60 mL/min (ref >60)
Glucose, Bld: 93 mg/dL (ref 70–99)
Potassium: 3.2 mmol/L — ABNORMAL LOW (ref 3.5–5.1)
Sodium: 130 mmol/L — ABNORMAL LOW (ref 135–145)
Total Bilirubin: 0.4 mg/dL (ref 0.3–1.2)
Total Protein: 8.3 g/dL — ABNORMAL HIGH (ref 6.5–8.1)

## 2022-11-01 LAB — URINALYSIS, ROUTINE W REFLEX MICROSCOPIC
Bilirubin Urine: NEGATIVE
Glucose, UA: NEGATIVE mg/dL
Ketones, ur: 20 mg/dL — AB
Nitrite: POSITIVE — AB
Protein, ur: 30 mg/dL — AB
Specific Gravity, Urine: 1.017 (ref 1.005–1.030)
WBC, UA: >50 WBC/hpf (ref 0–5)
pH: 5 (ref 5.0–8.0)

## 2022-11-01 LAB — LIPASE, BLOOD: Lipase: 27 U/L (ref 11–51)

## 2022-11-01 LAB — CBC
HCT: 36.2 % (ref 36.0–46.0)
Hemoglobin: 12.4 g/dL (ref 12.0–15.0)
MCH: 31.5 pg (ref 26.0–34.0)
MCHC: 34.3 g/dL (ref 30.0–36.0)
MCV: 91.9 fL (ref 80.0–100.0)
Platelets: 243 10*3/uL (ref 150–400)
RBC: 3.94 MIL/uL (ref 3.87–5.11)
RDW: 11.4 % — ABNORMAL LOW (ref 11.5–15.5)
WBC: 8.7 10*3/uL (ref 4.0–10.5)
nRBC: 0 % (ref 0.0–0.2)

## 2022-11-01 MED ORDER — SODIUM CHLORIDE 0.9 % IV SOLN: Freq: Once | INTRAVENOUS | Status: AC

## 2022-11-01 MED ORDER — SODIUM CHLORIDE 0.9 % IV SOLN: 1.0000 g | Freq: Once | INTRAVENOUS | Status: DC

## 2022-11-01 MED ORDER — ONDANSETRON 4 MG PO TBDP: 4.0000 mg | ORAL_TABLET | Freq: Four times a day (QID) | ORAL | 0 refills | Status: DC | PRN

## 2022-11-01 MED ORDER — LACTATED RINGERS IV SOLN: Freq: Once | INTRAVENOUS | Status: DC

## 2022-11-01 MED ORDER — SODIUM CHLORIDE 0.9 % IV SOLN
2.0000 g | Freq: Once | INTRAVENOUS | Status: AC
  Administered 2022-11-01: 2 g via INTRAVENOUS
  Filled 2022-11-01: qty 20

## 2022-11-01 NOTE — MAU Note (Signed)
..  Linda Caldwell is a 27 y.o. at [redacted]w[redacted]d here in MAU reporting: Stomach pain, fever, body aches and emesis that began 3 days ago. Has not taken any medication for it.   Pain score: faces 4 Vitals:   10/31/22 2349  BP: (!) 105/55  Pulse: (!) 105  Resp: 16  Temp: 100 F (37.8 C)  SpO2: 100%     FHT:169 Lab orders placed from triage:  UA

## 2022-11-01 NOTE — MAU Provider Note (Signed)
Chief Complaint: Fever   Event Date/Time   First Provider Initiated Contact with Patient 11/01/22 0138        SUBJECTIVE HPI: Linda Caldwell is a 27 y.o. G3P1011 at [redacted]w[redacted]d by LMP who presents to maternity admissions reporting nausea, vomiting and abdominal pain for 3 days.  Also has a fever. She denies vaginal bleeding, vaginal itching/burning, urinary symptoms, h/a, dizziness   N/V for whole pregnancy Fever and pain for 4 days  Fever  This is a new problem. The current episode started in the past 7 days. Her temperature was unmeasured prior to arrival. Associated symptoms include abdominal pain, nausea and vomiting. Pertinent negatives include no headaches. She has tried nothing for the symptoms.   RN Note: Linda Caldwell is a 27 y.o. at [redacted]w[redacted]d here in MAU reporting: Stomach pain, fever, body aches and emesis that began 3 days ago. Has not taken any medication for it.   Past Medical History:  Diagnosis Date   Medical history non-contributory    No pertinent past medical history    Past Surgical History:  Procedure Laterality Date   NO PAST SURGERIES     Social History   Socioeconomic History   Marital status: Married    Spouse name: Not on file   Number of children: 0   Years of education: Not on file   Highest education level: Not on file  Occupational History   Not on file  Tobacco Use   Smoking status: Never   Smokeless tobacco: Never  Vaping Use   Vaping Use: Never used  Substance and Sexual Activity   Alcohol use: Not Currently   Drug use: Never   Sexual activity: Yes  Other Topics Concern   Not on file  Social History Narrative   Not on file   Social Determinants of Health   Financial Resource Strain: Not on file  Food Insecurity: Not on file  Transportation Needs: Not on file  Physical Activity: Not on file  Stress: Not on file  Social Connections: Not on file  Intimate Partner Violence: Not on file   No current facility-administered  medications on file prior to encounter.   Current Outpatient Medications on File Prior to Encounter  Medication Sig Dispense Refill   acetaminophen (TYLENOL) 500 MG tablet Take 500 mg by mouth every 6 (six) hours as needed for headache, fever or moderate pain.     Prenatal Vit-Fe Phos-FA-Omega (VITAFOL GUMMIES) 3.33-0.333-34.8 MG CHEW Chew 3 tablets by mouth daily before breakfast. 90 tablet 11   promethazine (PHENERGAN) 25 MG tablet Take 1 tablet (25 mg total) by mouth every 6 (six) hours as needed for nausea or vomiting. 30 tablet 0   No Known Allergies  I have reviewed patient's Past Medical Hx, Surgical Hx, Family Hx, Social Hx, medications and allergies.   ROS:  Review of Systems  Constitutional:  Positive for fever.  Gastrointestinal:  Positive for abdominal pain, nausea and vomiting.  Neurological:  Negative for headaches.  Pain is right middle abdomen  Review of Systems  Other systems negative   Physical Exam  Physical Exam Patient Vitals for the past 24 hrs:  BP Temp Temp src Pulse Resp SpO2 Height Weight  10/31/22 2349 (!) 105/55 100 F (37.8 C) Oral (!) 105 16 100 % 5' 2.5" (1.588 m) 57 kg   Constitutional: Well-developed, well-nourished female in no acute distress.  Cardiovascular: normal rate Respiratory: normal effort GI: Abd soft, non-tender.  MS: Extremities nontender, no edema, normal ROM Neurologic: Alert  and oriented x 4.  GU: Neg CVAT.  PELVIC EXAM: deferred  FHT 169 by doppler  LAB RESULTS Results for orders placed or performed during the hospital encounter of 10/31/22 (from the past 24 hour(s))  Urinalysis, Routine w reflex microscopic -Urine, Clean Catch     Status: Abnormal   Collection Time: 11/01/22 12:09 AM  Result Value Ref Range   Color, Urine AMBER (A) YELLOW   APPearance CLOUDY (A) CLEAR   Specific Gravity, Urine 1.017 1.005 - 1.030   pH 5.0 5.0 - 8.0   Glucose, UA NEGATIVE NEGATIVE mg/dL   Hgb urine dipstick SMALL (A) NEGATIVE    Bilirubin Urine NEGATIVE NEGATIVE   Ketones, ur 20 (A) NEGATIVE mg/dL   Protein, ur 30 (A) NEGATIVE mg/dL   Nitrite POSITIVE (A) NEGATIVE   Leukocytes,Ua LARGE (A) NEGATIVE   RBC / HPF 6-10 0 - 5 RBC/hpf   WBC, UA >50 0 - 5 WBC/hpf   Bacteria, UA FEW (A) NONE SEEN   Squamous Epithelial / HPF 11-20 0 - 5 /HPF   WBC Clumps PRESENT    Mucus PRESENT   CBC     Status: Abnormal   Collection Time: 11/01/22  1:58 AM  Result Value Ref Range   WBC 8.7 4.0 - 10.5 K/uL   RBC 3.94 3.87 - 5.11 MIL/uL   Hemoglobin 12.4 12.0 - 15.0 g/dL   HCT 11.9 14.7 - 82.9 %   MCV 91.9 80.0 - 100.0 fL   MCH 31.5 26.0 - 34.0 pg   MCHC 34.3 30.0 - 36.0 g/dL   RDW 56.2 (L) 13.0 - 86.5 %   Platelets 243 150 - 400 K/uL   nRBC 0.0 0.0 - 0.2 %  Comprehensive metabolic panel     Status: Abnormal   Collection Time: 11/01/22  1:58 AM  Result Value Ref Range   Sodium 130 (L) 135 - 145 mmol/L   Potassium 3.2 (L) 3.5 - 5.1 mmol/L   Chloride 96 (L) 98 - 111 mmol/L   CO2 23 22 - 32 mmol/L   Glucose, Bld 93 70 - 99 mg/dL   BUN <5 (L) 6 - 20 mg/dL   Creatinine, Ser 7.84 0.44 - 1.00 mg/dL   Calcium 9.5 8.9 - 69.6 mg/dL   Total Protein 8.3 (H) 6.5 - 8.1 g/dL   Albumin 3.6 3.5 - 5.0 g/dL   AST 18 15 - 41 U/L   ALT 16 0 - 44 U/L   Alkaline Phosphatase 84 38 - 126 U/L   Total Bilirubin 0.4 0.3 - 1.2 mg/dL   GFR, Estimated >29 >52 mL/min   Anion gap 11 5 - 15  Lipase, blood     Status: None   Collection Time: 11/01/22  1:58 AM  Result Value Ref Range   Lipase 27 11 - 51 U/L     A/Positive/-- (04/03 1418)  IMAGING No results found.  MAU Management/MDM: I have reviewed the triage vital signs and the nursing notes.   Pertinent labs & imaging results that were available during my care of the patient were reviewed by me and considered in my medical decision making (see chart for details).      I have reviewed her medical records including past results, notes and treatments. Medical, Surgical, and family history were  reviewed.  Medications and recent lab tests were reviewed  Ordered labs which resulted as normal except for UA which is suspicious for infection .      Treatments in MAU included IV  hydration and 2gm of Rocephin.for presumed UTI.  Urine sent to culture.    .     ASSESSMENT Single IUP at [redacted]w[redacted]d Fever Abdominal pain Nausea/vomiting Probable UTI  PLAN Discharge home Rocephin should cover UTI but will send for culture Advised to push PO fluids, Tylenol for fever  Pt stable at time of discharge. Encouraged to return here if she develops worsening of symptoms, increase in pain, fever, or other concerning symptoms.    Wynelle Bourgeois CNM, MSN Certified Nurse-Midwife 11/01/2022  1:38 AM

## 2022-11-03 LAB — CULTURE, OB URINE: Culture: >=100000 — AB

## 2022-11-04 ENCOUNTER — Encounter: Payer: Self-pay | Admitting: Advanced Practice Midwife

## 2022-11-04 ENCOUNTER — Telehealth: Payer: Self-pay | Admitting: Certified Nurse Midwife

## 2022-11-04 ENCOUNTER — Encounter: Payer: Self-pay | Admitting: Certified Nurse Midwife

## 2022-11-04 DIAGNOSIS — O2342 Unspecified infection of urinary tract in pregnancy, second trimester: Secondary | ICD-10-CM

## 2022-11-04 DIAGNOSIS — O2340 Unspecified infection of urinary tract in pregnancy, unspecified trimester: Secondary | ICD-10-CM

## 2022-11-04 HISTORY — DX: Unspecified infection of urinary tract in pregnancy, unspecified trimester: O23.40

## 2022-11-04 MED ORDER — CEFADROXIL 500 MG PO CAPS
500.0000 mg | ORAL_CAPSULE | Freq: Two times a day (BID) | ORAL | 0 refills | Status: DC
Start: 2022-11-04 — End: 2022-12-12

## 2022-11-04 NOTE — Telephone Encounter (Signed)
Attempted to call pt with [place] interpreter [phone] but there was no answer. Message was left to call back [phone]. Message also sent via MyChart and Rx sent.

## 2022-11-14 ENCOUNTER — Ambulatory Visit (INDEPENDENT_AMBULATORY_CARE_PROVIDER_SITE_OTHER): Payer: Medicaid Other | Admitting: Advanced Practice Midwife

## 2022-11-14 VITALS — BP 102/58 | HR 75 | Wt 127.2 lb

## 2022-11-14 DIAGNOSIS — Z3A16 16 weeks gestation of pregnancy: Secondary | ICD-10-CM

## 2022-11-14 DIAGNOSIS — Z3492 Encounter for supervision of normal pregnancy, unspecified, second trimester: Secondary | ICD-10-CM

## 2022-11-14 NOTE — Progress Notes (Signed)
   PRENATAL VISIT NOTE  Subjective:  Linda Caldwell is a 27 y.o. G3P1011 at [redacted]w[redacted]d being seen today for ongoing prenatal care.  She is currently monitored for the following issues for this low-risk pregnancy and has Supervision of normal pregnancy, antepartum; Language barrier; Insufficient prenatal care in third trimester; Malaria during pregnancy, third trimester; Malaria; High risk medication use; Pregnancy; Labor and delivery indication for care or intervention; SVD (spontaneous vaginal delivery); Postpartum fever; Supervision of other normal pregnancy, antepartum; and UTI (urinary tract infection) during pregnancy on their problem list.  Patient reports no complaints.  Contractions: Not present. Vag. Bleeding: None.  Movement: Present. Denies leaking of fluid.   The following portions of the patient's history were reviewed and updated as appropriate: allergies, current medications, past family history, past medical history, past social history, past surgical history and problem list.   Objective:   Vitals:   11/14/22 1513 11/14/22 1529  BP: (!) 110/53 (!) 102/58  Pulse: 78 75  Weight: 127 lb 3.2 oz (57.7 kg)     Fetal Status:     Movement: Present     General:  Alert, oriented and cooperative. Patient is in no acute distress.  Skin: Skin is warm and dry. No rash noted.   Cardiovascular: Normal heart rate noted  Respiratory: Normal respiratory effort, no problems with respiration noted  Abdomen: Soft, gravid, appropriate for gestational age.  Pain/Pressure: Absent     Pelvic: Cervical exam deferred        Extremities: Normal range of motion.  Edema: None  Mental Status: Normal mood and affect. Normal behavior. Normal judgment and thought content.   Assessment and Plan:  Pregnancy: G3P1011 at [redacted]w[redacted]d 1. Encounter for supervision of low-risk pregnancy in second trimester --Anticipatory guidance about next visits/weeks of pregnancy given.  --Anatomy US scheduled 6/14  - AFP,  Serum, Open Spina Bifida  2. [redacted] weeks gestation of pregnancy   Preterm labor symptoms and general obstetric precautions including but not limited to vaginal bleeding, contractions, leaking of fluid and fetal movement were reviewed in detail with the patient. Please refer to After Visit Summary for other counseling recommendations.   Return in about 4 weeks (around 12/12/2022) for LOB, Midwife preferred.  Future Appointments  Date Time Provider Department Center  11/30/2022  9:45 AM [facility]  12/12/2022  3:30 PM Anyanwu, Jethro Bastos, MD [facility] None    Sharen Counter, CNM

## 2022-11-14 NOTE — Progress Notes (Signed)
Pt reports fetal movement, denies pain. Pt states that she did not take Cefadroxil that was prescribed last week because the pharmacy advised that they did not have it. Contacted pharmacy and advised pt that rx is ready for pick up, pt voiced understanding.

## 2022-11-16 LAB — AFP, SERUM, OPEN SPINA BIFIDA
AFP MoM: 1.37
AFP Value: 45.8 ng/mL
Gest. Age on Collection Date: 16 weeks
Maternal Age At EDD: 25.5 yr
OSBR Risk 1 IN: 7840
Test Results:: NEGATIVE
Weight: 177 [lb_av]

## 2022-11-17 DIAGNOSIS — Z419 Encounter for procedure for purposes other than remedying health state, unspecified: Secondary | ICD-10-CM | POA: Diagnosis not present

## 2022-11-29 ENCOUNTER — Encounter: Payer: Self-pay | Admitting: *Deleted

## 2022-11-29 DIAGNOSIS — Z8613 Personal history of malaria: Secondary | ICD-10-CM | POA: Insufficient documentation

## 2022-11-30 ENCOUNTER — Ambulatory Visit: Payer: Medicaid Other | Attending: Obstetrics & Gynecology

## 2022-11-30 DIAGNOSIS — Z363 Encounter for antenatal screening for malformations: Secondary | ICD-10-CM | POA: Insufficient documentation

## 2022-11-30 DIAGNOSIS — Z3480 Encounter for supervision of other normal pregnancy, unspecified trimester: Secondary | ICD-10-CM | POA: Diagnosis not present

## 2022-12-12 ENCOUNTER — Ambulatory Visit (INDEPENDENT_AMBULATORY_CARE_PROVIDER_SITE_OTHER): Payer: Medicaid Other | Admitting: Obstetrics & Gynecology

## 2022-12-12 ENCOUNTER — Encounter: Payer: Self-pay | Admitting: Obstetrics & Gynecology

## 2022-12-12 VITALS — BP 104/65 | HR 79 | Wt 128.0 lb

## 2022-12-12 DIAGNOSIS — Z3480 Encounter for supervision of other normal pregnancy, unspecified trimester: Secondary | ICD-10-CM

## 2022-12-12 DIAGNOSIS — Z3482 Encounter for supervision of other normal pregnancy, second trimester: Secondary | ICD-10-CM

## 2022-12-12 DIAGNOSIS — Z3A20 20 weeks gestation of pregnancy: Secondary | ICD-10-CM

## 2022-12-12 MED ORDER — VITAFOL GUMMIES 3.33-0.333-34.8 MG PO CHEW
3.0000 | CHEWABLE_TABLET | Freq: Every day | ORAL | 11 refills | Status: AC
Start: 2022-12-12 — End: ?

## 2022-12-12 NOTE — Progress Notes (Signed)
PRENATAL VISIT NOTE  Subjective:  Linda Caldwell is a 27 y.o. G3P1011 at [redacted]w[redacted]d being seen today for ongoing prenatal care.  Patient is French-speaking only, interpreter present for this encounter. She is currently monitored for the following issues for this low-risk pregnancy and has Supervision of other normal pregnancy, antepartum and History of malaria on their problem list.  Patient reports no complaints.  Contractions: Not present. Vag. Bleeding: None.  Movement: Present. Denies leaking of fluid.   The following portions of the patient's history were reviewed and updated as appropriate: allergies, current medications, past family history, past medical history, past social history, past surgical history and problem list.   Objective:   Vitals:   12/12/22 1538  BP: 104/65  Pulse: 79  Weight: 128 lb (58.1 kg)    Fetal Status: Fetal Heart Rate (bpm): 140   Movement: Present     General:  Alert, oriented and cooperative. Patient is in no acute distress.  Skin: Skin is warm and dry. No rash noted.   Cardiovascular: Normal heart rate noted  Respiratory: Normal respiratory effort, no problems with respiration noted  Abdomen: Soft, gravid, appropriate for gestational age.  Pain/Pressure: Present     Pelvic: Cervical exam deferred        Extremities: Normal range of motion.     Mental Status: Normal mood and affect. Normal behavior. Normal judgment and thought content.   [place] MFM OB COMP + 14 WK  Result Date: 11/30/2022 ----------------------------------------------------------------------  OBSTETRICS REPORT                       (Signed Final 11/30/2022 10:42 am) ---------------------------------------------------------------------- Patient Info  ID #:       [id number]                          D.O.B.:  1995/10/22 (27 yrs)  Name:       Linda Caldwell               Visit Date: 11/30/2022 09:39 am ---------------------------------------------------------------------- Performed By   Attending:        Braxton Feathers DO       Ref. Address:     [address]                                                             [place], [place]                                                             [address]  Performed By:     Marcellina Millin       [facility]  Referred By:      Adam Phenix                    MD ---------------------------------------------------------------------- Orders  #  Description                           Code        Ordered By  1  [place] MFM OB COMP + 14 WK                76805.01    Scheryl Darter ----------------------------------------------------------------------  #  Order #                     Accession #                Episode #  1  [id number]                   [id number]                 [id number] ---------------------------------------------------------------------- Indications  Encounter for antenatal screening for          Z36.3  malformations  [redacted] weeks gestation of pregnancy                Z3A.19  LR NIPS - Female, Negative Horizon,  Negative AFP ---------------------------------------------------------------------- Fetal Evaluation  Num Of Fetuses:         1  Fetal Heart Rate(bpm):  146  Cardiac Activity:       Observed  Presentation:           Cephalic  Placenta:               Fundal  P. Cord Insertion:      Visualized, central  Amniotic Fluid  AFI FV:      Within normal limits                              Largest Pocket(cm)                              8.89 ---------------------------------------------------------------------- Biometry  BPD:      43.9  mm     G. Age:  19w 2d         63  %    CI:        78.08   %    70 - 86                                                          FL/HC:      17.7   %    16.1 - 18.3  HC:      157.2  mm      G. Age:  18w 4d  24  %    HC/AC:      1.14        1.09 - 1.39  AC:      137.5  mm     G. Age:  19w 1d         52  %    FL/BPD:     63.3   %  FL:       27.8  mm     G. Age:  18w 4d         26  %    FL/AC:      20.2   %    20 - 24  HUM:      27.1  mm     G. Age:  18w 4d         40  %  CER:      19.1  mm     G. Age:  18w 5d         19  %  NFT:       3.0  mm  LV:        6.3  mm  CM:        4.5  mm  Est. FW:     261  gm      0 lb 9 oz     37  % ---------------------------------------------------------------------- OB History  Gravidity:    3         Term:   1        Prem:   0        SAB:   1  TOP:          0       Ectopic:  0        Living: 1 ---------------------------------------------------------------------- Gestational Age  LMP:           19w 0d        Date:  07/20/22                   EDD:   04/26/23  U/S Today:     18w 6d                                        EDD:   04/27/23  Best:          19w 0d     Det. By:  LMP  (07/20/22)          EDD:   04/26/23 ---------------------------------------------------------------------- Anatomy  Cranium:               Appears normal         LVOT:                   Appears normal  Cavum:                 Appears normal         Aortic Arch:            Appears normal  Ventricles:            Appears normal         Ductal Arch:            Appears normal  Choroid Plexus:        Appears normal         Diaphragm:  Appears normal  Cerebellum:            Appears normal         Stomach:                Appears normal, left                                                                        sided  Posterior Fossa:       Appears normal         Abdomen:                Appears normal  Nuchal Fold:           Appears normal         Abdominal Wall:         Appears nml (cord                                                                        insert, abd wall)  Face:                  Appears normal         Cord Vessels:           Appears normal (3                         (orbits  and profile)                           vessel cord)  Lips:                  Appears normal         Kidneys:                Appear normal  Palate:                Appears normal         Bladder:                Appears normal  Thoracic:              Appears normal         Spine:                  Appears normal  Heart:                 Appears normal         Upper Extremities:      Appears normal                         (4CH, axis, and                         situs)  RVOT:  Appears normal         Lower Extremities:      Appears normal  Other:  Fetus appears to be female. Heels/feet and open hands/5th digits,          Nasal bone, lenses, maxilla, mandible and falx, VC, 3VV and 3VTV          visualized. ---------------------------------------------------------------------- Cervix Uterus Adnexa  Cervix  Length:            3.1  cm.  Normal appearance by transabdominal scan  Uterus  No abnormality visualized.  Right Ovary  Size(cm)     2.91   x   1.62   x  1.78      Vol(ml): 4.39  Within normal limits.  Left Ovary  No adnexal mass visualized.  Cul De Sac  No free fluid seen.  Adnexa  No adnexal mass visualized ---------------------------------------------------------------------- Comments  Ms. Fritzler is at [employer] here for a basic anatomic survey.  She has no further concerns today.  EDD: 04/26/2023.  Dating: LMP  (07/20/22).  Other pregnancy complications: none  Aneuplolidy screening: low risk  Sonographic findings  Single intrauterine pregnancy.  Fetal cardiac activity:  Observed and appears normal.  Presentation: Cephalic.  The anatomic structures that were well seen appear normal  without evidence of soft markers. Due to poor acoustic  windows, the visualization some structures remain  suboptimally seen.  Fetal biometry shows the estimated fetal weight at the 37  percentile.  Amniotic fluid volume: Within normal limits. MVP: 8.89 cm.  Placenta: Fundal.  Recommendations  - The fluid is at the upper limit of  normal. If the patient  measures size > dates please refer back for future  ultrasounds. ----------------------------------------------------------------------                  Braxton Feathers, DO Electronically Signed Final Report   11/30/2022 10:42 am ----------------------------------------------------------------------   Assessment and Plan:  Pregnancy: G3P1011 at [redacted]w[redacted]d 1. [redacted] weeks gestation of pregnancy 2. Supervision of other normal pregnancy, antepartum Gave information about waterbirth, will discuss with CNM next visit.  - Prenatal Vit-Fe Phos-FA-Omega (VITAFOL GUMMIES) 3.33-0.333-34.8 MG CHEW; Chew 3 tablets by mouth daily before breakfast.  Dispense: 90 tablet; Refill: 11 Second trimester expectations reviewed. Preterm labor symptoms and general obstetric precautions including but not limited to vaginal bleeding, contractions, leaking of fluid and fetal movement were reviewed in detail with the patient. Please refer to After Visit Summary for other counseling recommendations.   Return in about 4 weeks (around 01/09/2023) for OFFICE OB VISIT (CNM preferred) - she prefers midwife to discuss waterbirth.  Future Appointments  Date Time Provider Department Center  01/07/2023  3:30 PM Leftwich-Kirby, Wilmer Floor, CNM [facility] None    Jaynie Collins, MD

## 2022-12-12 NOTE — Patient Instructions (Signed)

## 2022-12-17 DIAGNOSIS — Z419 Encounter for procedure for purposes other than remedying health state, unspecified: Secondary | ICD-10-CM | POA: Diagnosis not present

## 2023-01-07 ENCOUNTER — Ambulatory Visit: Payer: Medicaid Other | Admitting: Advanced Practice Midwife

## 2023-01-07 ENCOUNTER — Encounter: Payer: Self-pay | Admitting: Advanced Practice Midwife

## 2023-01-07 VITALS — BP 100/62 | HR 80 | Wt 135.8 lb

## 2023-01-07 DIAGNOSIS — R12 Heartburn: Secondary | ICD-10-CM

## 2023-01-07 DIAGNOSIS — O26892 Other specified pregnancy related conditions, second trimester: Secondary | ICD-10-CM

## 2023-01-07 DIAGNOSIS — Z3A24 24 weeks gestation of pregnancy: Secondary | ICD-10-CM

## 2023-01-07 DIAGNOSIS — Z3480 Encounter for supervision of other normal pregnancy, unspecified trimester: Secondary | ICD-10-CM

## 2023-01-07 MED ORDER — FAMOTIDINE 20 MG PO TABS
20.0000 mg | ORAL_TABLET | Freq: Two times a day (BID) | ORAL | 2 refills | Status: DC | PRN
Start: 2023-01-07 — End: 2023-05-02

## 2023-01-07 NOTE — Progress Notes (Signed)
Pt presents for ROB visit. No concerns.  

## 2023-01-07 NOTE — Progress Notes (Signed)
   PRENATAL VISIT NOTE  Subjective:  Linda Caldwell is a 27 y.o. G3P1011 at [redacted]w[redacted]d being seen today for ongoing prenatal care.  She is currently monitored for the following issues for this low-risk pregnancy and has Supervision of other normal pregnancy, antepartum and History of malaria on their problem list.  Patient reports heartburn.  Contractions: Not present. Vag. Bleeding: None.  Movement: Present. Denies leaking of fluid.   The following portions of the patient's history were reviewed and updated as appropriate: allergies, current medications, past family history, past medical history, past social history, past surgical history and problem list.   Objective:   Vitals:   01/07/23 1521  BP: 100/62  Pulse: 80  Weight: 135 lb 12.8 oz (61.6 kg)    Fetal Status: Fetal Heart Rate (bpm): 154 Fundal Height: 25 cm Movement: Present     General:  Alert, oriented and cooperative. Patient is in no acute distress.  Skin: Skin is warm and dry. No rash noted.   Cardiovascular: Normal heart rate noted  Respiratory: Normal respiratory effort, no problems with respiration noted  Abdomen: Soft, gravid, appropriate for gestational age.  Pain/Pressure: Absent     Pelvic: Cervical exam deferred        Extremities: Normal range of motion.  Edema: None  Mental Status: Normal mood and affect. Normal behavior. Normal judgment and thought content.   Assessment and Plan:  Pregnancy: G3P1011 at [redacted]w[redacted]d 1. Supervision of other normal pregnancy, antepartum --Anticipatory guidance about next visits/weeks of pregnancy given.  --Pt given written info on signing up for the waterbirth class, french interpreter used and verified patient's understanding of class registration. Pt to f/u if any difficulty due to language barrier or any other problems. --No barriers to waterbirth at this time.  2. [redacted] weeks gestation of pregnancy   3. Heartburn during pregnancy, antepartum, second trimester --Discussed diet  changes, Tums if pt wants to try.  - famotidine (PEPCID) 20 MG tablet; Take 1 tablet (20 mg total) by mouth 2 (two) times daily as needed for heartburn or indigestion.  Dispense: 60 tablet; Refill: 2   Preterm labor symptoms and general obstetric precautions including but not limited to vaginal bleeding, contractions, leaking of fluid and fetal movement were reviewed in detail with the patient. Please refer to After Visit Summary for other counseling recommendations.   No follow-ups on file.  Future Appointments  Date Time Provider Department Center  02/04/2023  9:15 AM [facility] None  02/04/2023 10:35 AM Leftwich-Kirby, Wilmer Floor, CNM [facility] None    Sharen Counter, CNM

## 2023-01-08 ENCOUNTER — Encounter (HOSPITAL_COMMUNITY): Payer: Self-pay | Admitting: Obstetrics and Gynecology

## 2023-01-08 ENCOUNTER — Inpatient Hospital Stay (HOSPITAL_COMMUNITY)
Admission: AD | Admit: 2023-01-08 | Discharge: 2023-01-09 | Disposition: A | Discharge status: Home / Self Care | Payer: Medicaid Other | Attending: Obstetrics and Gynecology | Admitting: Obstetrics and Gynecology

## 2023-01-08 DIAGNOSIS — R1084 Generalized abdominal pain: Secondary | ICD-10-CM | POA: Diagnosis present

## 2023-01-08 DIAGNOSIS — R1013 Epigastric pain: Secondary | ICD-10-CM | POA: Diagnosis not present

## 2023-01-08 DIAGNOSIS — Z3A24 24 weeks gestation of pregnancy: Secondary | ICD-10-CM | POA: Insufficient documentation

## 2023-01-08 DIAGNOSIS — K2100 Gastro-esophageal reflux disease with esophagitis, without bleeding: Secondary | ICD-10-CM | POA: Diagnosis not present

## 2023-01-08 DIAGNOSIS — O4703 False labor before 37 completed weeks of gestation, third trimester: Secondary | ICD-10-CM | POA: Diagnosis not present

## 2023-01-08 DIAGNOSIS — R1032 Left lower quadrant pain: Secondary | ICD-10-CM | POA: Diagnosis not present

## 2023-01-08 DIAGNOSIS — O99612 Diseases of the digestive system complicating pregnancy, second trimester: Secondary | ICD-10-CM | POA: Insufficient documentation

## 2023-01-08 DIAGNOSIS — O4702 False labor before 37 completed weeks of gestation, second trimester: Secondary | ICD-10-CM | POA: Diagnosis not present

## 2023-01-08 DIAGNOSIS — N858 Other specified noninflammatory disorders of uterus: Secondary | ICD-10-CM | POA: Diagnosis not present

## 2023-01-08 DIAGNOSIS — O26892 Other specified pregnancy related conditions, second trimester: Secondary | ICD-10-CM | POA: Insufficient documentation

## 2023-01-08 DIAGNOSIS — R1031 Right lower quadrant pain: Secondary | ICD-10-CM | POA: Insufficient documentation

## 2023-01-08 DIAGNOSIS — O4700 False labor before 37 completed weeks of gestation, unspecified trimester: Secondary | ICD-10-CM | POA: Diagnosis present

## 2023-01-08 DIAGNOSIS — K219 Gastro-esophageal reflux disease without esophagitis: Secondary | ICD-10-CM | POA: Insufficient documentation

## 2023-01-08 LAB — URINALYSIS, ROUTINE W REFLEX MICROSCOPIC
Bilirubin Urine: NEGATIVE
Glucose, UA: NEGATIVE mg/dL
Hgb urine dipstick: NEGATIVE
Ketones, ur: NEGATIVE mg/dL
Leukocytes,Ua: NEGATIVE
Nitrite: NEGATIVE
Protein, ur: NEGATIVE mg/dL
Specific Gravity, Urine: 1.012 (ref 1.005–1.030)
pH: 6 (ref 5.0–8.0)

## 2023-01-08 NOTE — MAU Provider Note (Signed)
Chief Complaint:  Abdominal Pain   Event Date/Time   First Provider Initiated Contact with Patient 01/08/23 2341     HPI: Linda Caldwell is a 27 y.o. G3P1011 at 13w4dwho presents to maternity admissions reporting pain in upper and lower abdomen.  Also has acid coming up from stomach, burning in esophagus.  Has not taken anything for it.  It is more bothersome than lower abdominal cramping. . She reports good fetal movement, denies LOF, vaginal bleeding, n/v, diarrhea, or fever/chills.    Abdominal Pain This is a new problem. The current episode started today. The problem has been unchanged. The pain is located in the epigastric region, LLQ and RLQ. The quality of the pain is cramping, burning and dull. The abdominal pain does not radiate. Pertinent negatives include no anorexia, diarrhea, dysuria, fever, frequency, myalgias, nausea or vomiting. Nothing aggravates the pain. The pain is relieved by Nothing. She has tried nothing for the symptoms.   RN Note: Linda Caldwell is a 27 y.o. at [redacted]w[redacted]d here in MAU reporting having pain in upper and lower abd since 1700. Reports good FM and denies VB or lOF.   Onset of complaint: 1700 Pain score: 4  Past Medical History: Past Medical History:  Diagnosis Date   High risk medication use 06/06/2021   Insufficient prenatal care in third trimester 05/18/2021   Labor and delivery indication for care or intervention 06/17/2021   Language barrier 12/08/2020   Malaria 06/06/2021   Malaria during pregnancy, third trimester 05/19/2021   Medical history non-contributory    No pertinent past medical history    Postpartum fever 06/19/2021   Pregnancy 06/06/2021   Supervision of normal pregnancy, antepartum 11/23/2020          Nursing Staff  Provider  Office Location   [facility]  Dating    LMP  Language    [facility]  Anatomy US    [place]  Flu Vaccine    Yes per pt  Genetic/Carrier Screen   NIPS:     AFP:   Not done/ care [facility] [place]  Horizon:  TDaP Vaccine     06/16/21  Hgb A1C or   GTT  Early   Third trimester   COVID Vaccine   NO     LAB RESULTS   Rhogam      Blood Type  A/Positive/-- (06/09 1421)   Baby Feeding Plan      SVD (spontaneous vaginal delivery) 06/17/2021   UTI (urinary tract infection) during pregnancy 11/04/2022   Treated with Rocephin 2gm    Past obstetric history: OB History  Gravida Para Term Preterm AB Living  3 1 1  0 1 1  SAB IAB Ectopic Multiple Live Births  1 0 0 0 1    # Outcome Date GA Lbr Len/2nd Weight Sex Type Anes PTL Lv  3 Current           2 Term 06/17/21 [redacted]w[redacted]d 14:23 / 00:44 3204 g F Vag-Spont EPI  LIV  1 SAB 2018     SAB       Past Surgical History: Past Surgical History:  Procedure Laterality Date   NO PAST SURGERIES      Family History: Family History  Problem Relation Age of Onset   Hypertension Paternal Grandfather    Hyperlipidemia Maternal Aunt    Hypertension Paternal Aunt     Social History: Social History   Tobacco Use   Smoking status: Never   Smokeless tobacco: Never  Vaping Use  Vaping status: Never Used  Substance Use Topics   Alcohol use: Not Currently   Drug use: Never    Allergies: No Known Allergies  Meds:  Medications Prior to Admission  Medication Sig Dispense Refill Last Dose   famotidine (PEPCID) 20 MG tablet Take 1 tablet (20 mg total) by mouth 2 (two) times daily as needed for heartburn or indigestion. 60 tablet 2 01/08/2023   Prenatal Vit-Fe Phos-FA-Omega (VITAFOL GUMMIES) 3.33-0.333-34.8 MG CHEW Chew 3 tablets by mouth daily before breakfast. 90 tablet 11 01/08/2023   acetaminophen (TYLENOL) 500 MG tablet Take 500 mg by mouth every 6 (six) hours as needed for headache, fever or moderate pain. (Patient not taking: Reported on 11/14/2022)      ondansetron (ZOFRAN-ODT) 4 MG disintegrating tablet Take 1 tablet (4 mg total) by mouth every 6 (six) hours as needed for nausea. (Patient not taking: Reported on 11/14/2022) 20 tablet 0    promethazine (PHENERGAN) 25 MG tablet  Take 1 tablet (25 mg total) by mouth every 6 (six) hours as needed for nausea or vomiting. (Patient not taking: Reported on 11/14/2022) 30 tablet 0     I have reviewed patient's Past Medical Hx, Surgical Hx, Family Hx, Social Hx, medications and allergies.   ROS:  Review of Systems  Constitutional:  Negative for fever.  Gastrointestinal:  Positive for abdominal pain. Negative for anorexia, diarrhea, nausea and vomiting.  Genitourinary:  Negative for dysuria and frequency.  Musculoskeletal:  Negative for myalgias.   Other systems negative  Physical Exam  Patient Vitals for the past 24 hrs:  BP Temp Pulse Resp SpO2 Height Weight  01/08/23 2311 113/61 98.2 F (36.8 C) 75 18 100 % 5\' 2"  (1.575 m) 61.7 kg   Constitutional: Well-developed, well-nourished female in no acute distress.  Cardiovascular: normal rate and rhythm Respiratory: normal effort, clear to auscultation bilaterally GI: Abd soft, non-tender, gravid appropriate for gestational age.   No rebound or guarding. MS: Extremities nontender, no edema, normal ROM Neurologic: Alert and oriented x 4.  GU: Neg CVAT.  PELVIC EXAM:  Dilation: Fingertip Effacement (%): 50 Station: Ballotable Exam by:: Wynelle Bourgeois, CNM Cervix feels multiparous Rechecked after treatment and is unchanged Fetal fibronectin sent  FHT:  Baseline 140 , moderate variability, accelerations present, no decelerations Contractions: Uterine irritability   Labs: Results for orders placed or performed during the hospital encounter of 01/08/23 (from the past 24 hour(s))  Urinalysis, Routine w reflex microscopic -Urine, Clean Catch     Status: Abnormal   Collection Time: 01/08/23 11:20 PM  Result Value Ref Range   Color, Urine STRAW (A) YELLOW   APPearance CLEAR CLEAR   Specific Gravity, Urine 1.012 1.005 - 1.030   pH 6.0 5.0 - 8.0   Glucose, UA NEGATIVE NEGATIVE mg/dL   Hgb urine dipstick NEGATIVE NEGATIVE   Bilirubin Urine NEGATIVE NEGATIVE    Ketones, ur NEGATIVE NEGATIVE mg/dL   Protein, ur NEGATIVE NEGATIVE mg/dL   Nitrite NEGATIVE NEGATIVE   Leukocytes,Ua NEGATIVE NEGATIVE  Fetal fibronectin     Status: None   Collection Time: 01/08/23 11:57 PM  Result Value Ref Range   Fetal Fibronectin NEGATIVE NEGATIVE    A/Positive/-- (04/03 1418)  Imaging:  No results found.  MAU Course/MDM: I have reviewed the triage vital signs and the nursing notes.   Pertinent labs & imaging results that were available during my care of the patient were reviewed by me and considered in my medical decision making (see chart for details).  I have reviewed her medical records including past results, notes and treatments.   I have ordered labs and reviewed results. UA is clear and fetal fibronectin is negative NST reviewed  Treatments in MAU included Procardia x 2 doses diminished irritability and cervix was unchanged. We gave Pepcid and Maalox which resolved epigastric pain.    Assessment: Single IUP at [redacted]w[redacted]d Acid reflux with esophagitis Uterine irritability, threatened preterm labor  Plan: Discharge home Preterm Labor precautions and fetal kick counts Rx Protonix for GERD Follow up in Office for prenatal visits and recheck Encouraged to return if she develops worsening of symptoms, increase in pain, fever, or other concerning symptoms.   Pt stable at time of discharge.  Wynelle Bourgeois CNM, MSN Certified Nurse-Midwife 01/08/2023 11:41 PM

## 2023-01-08 NOTE — MAU Note (Signed)
.  Linda Caldwell is a 27 y.o. at [redacted]w[redacted]d here in MAU reporting having pain in upper and lower abd since 1700. Reports good FM and denies VB or lOF.   Onset of complaint: 1700 Pain score: 4 Vitals:   01/08/23 2311  BP: 113/61  Pulse: 75  Resp: 18  Temp: 98.2 F (36.8 C)  SpO2: 100%     FHT:160 Lab orders placed from triage:  u/a

## 2023-01-09 DIAGNOSIS — N858 Other specified noninflammatory disorders of uterus: Secondary | ICD-10-CM

## 2023-01-09 DIAGNOSIS — K2100 Gastro-esophageal reflux disease with esophagitis, without bleeding: Secondary | ICD-10-CM

## 2023-01-09 DIAGNOSIS — O4703 False labor before 37 completed weeks of gestation, third trimester: Secondary | ICD-10-CM | POA: Diagnosis not present

## 2023-01-09 DIAGNOSIS — K219 Gastro-esophageal reflux disease without esophagitis: Secondary | ICD-10-CM | POA: Insufficient documentation

## 2023-01-09 DIAGNOSIS — Z3A24 24 weeks gestation of pregnancy: Secondary | ICD-10-CM | POA: Diagnosis not present

## 2023-01-09 DIAGNOSIS — O4700 False labor before 37 completed weeks of gestation, unspecified trimester: Secondary | ICD-10-CM | POA: Diagnosis present

## 2023-01-09 LAB — FETAL FIBRONECTIN: Fetal Fibronectin: NEGATIVE

## 2023-01-09 MED ORDER — FAMOTIDINE 20 MG PO TABS
20.0000 mg | ORAL_TABLET | Freq: Once | ORAL | Status: AC
  Administered 2023-01-09: 20 mg via ORAL
  Filled 2023-01-09: qty 1

## 2023-01-09 MED ORDER — PANTOPRAZOLE SODIUM 20 MG PO TBEC: 20.0000 mg | DELAYED_RELEASE_TABLET | Freq: Every day | ORAL | 1 refills | Status: DC

## 2023-01-09 MED ORDER — NIFEDIPINE 10 MG PO CAPS
10.0000 mg | ORAL_CAPSULE | ORAL | Status: DC | PRN
  Administered 2023-01-09 (×2): 10 mg via ORAL
  Filled 2023-01-09 (×2): qty 1

## 2023-01-09 MED ORDER — ALUM & MAG HYDROXIDE-SIMETH 200-200-20 MG/5ML PO SUSP
30.0000 mL | Freq: Once | ORAL | Status: AC
  Administered 2023-01-09: 30 mL via ORAL
  Filled 2023-01-09: qty 30

## 2023-01-17 DIAGNOSIS — Z419 Encounter for procedure for purposes other than remedying health state, unspecified: Secondary | ICD-10-CM | POA: Diagnosis not present

## 2023-02-04 ENCOUNTER — Other Ambulatory Visit: Payer: Medicaid Other

## 2023-02-04 ENCOUNTER — Ambulatory Visit (INDEPENDENT_AMBULATORY_CARE_PROVIDER_SITE_OTHER): Payer: Medicaid Other | Admitting: Advanced Practice Midwife

## 2023-02-04 VITALS — BP 104/62 | HR 91 | Wt 140.9 lb

## 2023-02-04 DIAGNOSIS — Z3009 Encounter for other general counseling and advice on contraception: Secondary | ICD-10-CM

## 2023-02-04 DIAGNOSIS — Z3480 Encounter for supervision of other normal pregnancy, unspecified trimester: Secondary | ICD-10-CM

## 2023-02-04 DIAGNOSIS — Z3A28 28 weeks gestation of pregnancy: Secondary | ICD-10-CM | POA: Diagnosis not present

## 2023-02-04 DIAGNOSIS — O99013 Anemia complicating pregnancy, third trimester: Secondary | ICD-10-CM

## 2023-02-04 DIAGNOSIS — O26892 Other specified pregnancy related conditions, second trimester: Secondary | ICD-10-CM

## 2023-02-04 DIAGNOSIS — R12 Heartburn: Secondary | ICD-10-CM

## 2023-02-04 NOTE — Progress Notes (Signed)
   PRENATAL VISIT NOTE  Subjective:  Linda Caldwell is a 27 y.o. G3P1011 at [redacted]w[redacted]d being seen today for ongoing prenatal care.  She is currently monitored for the following issues for this low-risk pregnancy and has Supervision of other normal pregnancy, antepartum; History of malaria; Threatened premature labor; and GERD (gastroesophageal reflux disease) on their problem list.  Patient reports no complaints.  Contractions: Irritability. Vag. Bleeding: None.  Movement: Present. Denies leaking of fluid.   The following portions of the patient's history were reviewed and updated as appropriate: allergies, current medications, past family history, past medical history, past social history, past surgical history and problem list.   Objective:   Vitals:   02/04/23 1017  BP: 104/62  Pulse: 91  Weight: 140 lb 14.4 oz (63.9 kg)    Fetal Status: Fetal Heart Rate (bpm): 140   Movement: Present     General:  Alert, oriented and cooperative. Patient is in no acute distress.  Skin: Skin is warm and dry. No rash noted.   Cardiovascular: Normal heart rate noted  Respiratory: Normal respiratory effort, no problems with respiration noted  Abdomen: Soft, gravid, appropriate for gestational age.  Pain/Pressure: Absent     Pelvic: Cervical exam deferred        Extremities: Normal range of motion.  Edema: None  Mental Status: Normal mood and affect. Normal behavior. Normal judgment and thought content.   Assessment and Plan:  Pregnancy: G3P1011 at [redacted]w[redacted]d 1. Supervision of other normal pregnancy, antepartum --Anticipatory guidance about next visits/weeks of pregnancy given.  - Pt interested in waterbirth and has attended the class.  - Reviewed conditions in labor that will risk her out of water immersion including thick meconium or blood stained amniotic fluid, non-reassuring fetal status on monitor, excessive bleeding, hypertension, dizziness, use of IV meds, damaged equipment or staffing that does  not allow for water immersion, etc.  - The attending midwife must be on the unit for water immersion to begin; pt understands this may delay the start of water immersion. - Discussed other labor support options if waterbirth becomes unavailable, including position change, freedom of movement, use of birthing ball, and/or use of hydrotherapy in the shower (dependent upon medical condition/provider discretion).    - Glucose Tolerance, 2 Hours w/1 Hour - HIV antibody (with reflex) - RPR - CBC  2. Heartburn during pregnancy, antepartum, second trimester --Improved with Pepcid  3. [redacted] weeks gestation of pregnancy  4. Encounter for counseling regarding contraception --Discussed pt contraceptive plans and reviewed contraceptive methods based on pt preferences and effectiveness.  Pt desires LARC, but undecided between Nexplanon and IUD.  Will continue to review and pt to think about it and return with questions for the provider.   Preterm labor symptoms and general obstetric precautions including but not limited to vaginal bleeding, contractions, leaking of fluid and fetal movement were reviewed in detail with the patient. Please refer to After Visit Summary for other counseling recommendations.   Return in about 2 weeks (around 02/18/2023) for Midwife preferred, prefers with me.  Future Appointments  Date Time Provider Department Center  02/26/2023  2:30 PM Leftwich-Kirby, Wilmer Floor, CNM [facility] None    Sharen Counter, CNM

## 2023-02-05 LAB — GLUCOSE TOLERANCE, 2 HOURS W/ 1HR
Glucose, 1 hour: 132 mg/dL (ref 70–179)
Glucose, 2 hour: 112 mg/dL (ref 70–152)
Glucose, Fasting: 73 mg/dL (ref 70–91)

## 2023-02-05 LAB — CBC
Hematocrit: 32.4 % — ABNORMAL LOW (ref 34.0–46.6)
Hemoglobin: 10.3 g/dL — ABNORMAL LOW (ref 11.1–15.9)
MCH: 31.7 pg (ref 26.6–33.0)
MCHC: 31.8 g/dL (ref 31.5–35.7)
MCV: 100 fL — ABNORMAL HIGH (ref 79–97)
Platelets: 177 10*3/uL (ref 150–450)
RBC: 3.25 x10E6/uL — ABNORMAL LOW (ref 3.77–5.28)
RDW: 13.1 % (ref 11.7–15.4)
WBC: 5.9 10*3/uL (ref 3.4–10.8)

## 2023-02-05 LAB — HIV ANTIBODY (ROUTINE TESTING W REFLEX): HIV Screen 4th Generation wRfx: NONREACTIVE

## 2023-02-05 LAB — RPR: RPR Ser Ql: NONREACTIVE

## 2023-02-09 DIAGNOSIS — O99013 Anemia complicating pregnancy, third trimester: Secondary | ICD-10-CM | POA: Insufficient documentation

## 2023-02-09 MED ORDER — FERROUS SULFATE 325 (65 FE) MG PO TABS
325.0000 mg | ORAL_TABLET | ORAL | 3 refills | Status: AC
Start: 2023-02-09 — End: ?

## 2023-02-09 NOTE — Addendum Note (Signed)
Addended by: Sharen Counter A on: 02/09/2023 11:53 AM   Modules accepted: Orders

## 2023-02-17 DIAGNOSIS — Z419 Encounter for procedure for purposes other than remedying health state, unspecified: Secondary | ICD-10-CM | POA: Diagnosis not present

## 2023-02-26 ENCOUNTER — Ambulatory Visit (INDEPENDENT_AMBULATORY_CARE_PROVIDER_SITE_OTHER): Payer: Medicaid Other | Admitting: Advanced Practice Midwife

## 2023-02-26 ENCOUNTER — Encounter: Payer: Self-pay | Admitting: Advanced Practice Midwife

## 2023-02-26 VITALS — BP 93/64 | HR 81 | Wt 145.4 lb

## 2023-02-26 DIAGNOSIS — Z3A31 31 weeks gestation of pregnancy: Secondary | ICD-10-CM

## 2023-02-26 DIAGNOSIS — Z3480 Encounter for supervision of other normal pregnancy, unspecified trimester: Secondary | ICD-10-CM

## 2023-02-26 NOTE — Progress Notes (Signed)
   PRENATAL VISIT NOTE  Subjective:  Linda Caldwell is a 27 y.o. G3P1011 at [redacted]w[redacted]d being seen today for ongoing prenatal care.  She is currently monitored for the following issues for this low-risk pregnancy and has Supervision of other normal pregnancy, antepartum; History of malaria; Threatened premature labor; GERD (gastroesophageal reflux disease); and Anemia affecting pregnancy in third trimester on their problem list.  Patient reports no complaints.  Contractions: Irritability. Vag. Bleeding: None.  Movement: Present. Denies leaking of fluid.   The following portions of the patient's history were reviewed and updated as appropriate: allergies, current medications, past family history, past medical history, past social history, past surgical history and problem list.   Objective:   Vitals:   02/26/23 1431  BP: 93/64  Pulse: 81  Weight: 145 lb 6.4 oz (66 kg)    Fetal Status: Fetal Heart Rate (bpm): 142   Movement: Present     General:  Alert, oriented and cooperative. Patient is in no acute distress.  Skin: Skin is warm and dry. No rash noted.   Cardiovascular: Normal heart rate noted  Respiratory: Normal respiratory effort, no problems with respiration noted  Abdomen: Soft, gravid, appropriate for gestational age.  Pain/Pressure: Absent     Pelvic: Cervical exam deferred        Extremities: Normal range of motion.  Edema: None  Mental Status: Normal mood and affect. Normal behavior. Normal judgment and thought content.   Assessment and Plan:  Pregnancy: G3P1011 at [redacted]w[redacted]d 1. Supervision of other normal pregnancy, antepartum --Anticipatory guidance about next visits/weeks of pregnancy given.  - Pt is interested in waterbirth.  No contraindications at this time per chart review/patient assessment.   - Pt to enroll in class, see CNMs for most visits in the office.  - Discussed waterbirth as option for low-risk pregnancy.  Reviewed conditions that may arise during pregnancy that  will risk pt out of waterbirth including hypertension, diabetes, fetal growth restriction <10%ile, etc.   2. [redacted] weeks gestation of pregnancy   Preterm labor symptoms and general obstetric precautions including but not limited to vaginal bleeding, contractions, leaking of fluid and fetal movement were reviewed in detail with the patient. Please refer to After Visit Summary for other counseling recommendations.   Return in about 2 weeks (around 03/12/2023) for Midwife preferred.  Future Appointments  Date Time Provider Department Center  03/12/2023  1:50 PM Hurshel Party, CNM [facility] None  03/26/2023  2:30 PM Hessie Dibble, MD [facility] None  04/02/2023  2:30 PM Joanne Gavel, MD [facility] None  04/09/2023  2:30 PM Leftwich-Kirby, Wilmer Floor, CNM [facility] None  04/16/2023  2:30 PM Constant, Gigi Gin, MD [facility] None    Sharen Counter, CNM

## 2023-03-08 ENCOUNTER — Encounter (HOSPITAL_COMMUNITY): Payer: Self-pay | Admitting: Obstetrics and Gynecology

## 2023-03-08 ENCOUNTER — Inpatient Hospital Stay (HOSPITAL_COMMUNITY)
Admission: AD | Admit: 2023-03-08 | Discharge: 2023-03-08 | Disposition: A | Discharge status: Home / Self Care | Payer: Medicaid Other | Attending: Obstetrics and Gynecology | Admitting: Obstetrics and Gynecology

## 2023-03-08 ENCOUNTER — Encounter: Payer: Self-pay | Admitting: Obstetrics and Gynecology

## 2023-03-08 DIAGNOSIS — O4703 False labor before 37 completed weeks of gestation, third trimester: Secondary | ICD-10-CM | POA: Insufficient documentation

## 2023-03-08 DIAGNOSIS — O99013 Anemia complicating pregnancy, third trimester: Secondary | ICD-10-CM | POA: Diagnosis not present

## 2023-03-08 DIAGNOSIS — K219 Gastro-esophageal reflux disease without esophagitis: Secondary | ICD-10-CM

## 2023-03-08 DIAGNOSIS — Z3A33 33 weeks gestation of pregnancy: Secondary | ICD-10-CM | POA: Insufficient documentation

## 2023-03-08 DIAGNOSIS — R1013 Epigastric pain: Secondary | ICD-10-CM | POA: Insufficient documentation

## 2023-03-08 DIAGNOSIS — Z3480 Encounter for supervision of other normal pregnancy, unspecified trimester: Secondary | ICD-10-CM

## 2023-03-08 DIAGNOSIS — D649 Anemia, unspecified: Secondary | ICD-10-CM | POA: Insufficient documentation

## 2023-03-08 DIAGNOSIS — R252 Cramp and spasm: Secondary | ICD-10-CM | POA: Diagnosis present

## 2023-03-08 DIAGNOSIS — O26893 Other specified pregnancy related conditions, third trimester: Secondary | ICD-10-CM | POA: Diagnosis not present

## 2023-03-08 DIAGNOSIS — O4700 False labor before 37 completed weeks of gestation, unspecified trimester: Secondary | ICD-10-CM

## 2023-03-08 LAB — URINALYSIS, ROUTINE W REFLEX MICROSCOPIC
Bilirubin Urine: NEGATIVE
Glucose, UA: NEGATIVE mg/dL
Hgb urine dipstick: NEGATIVE
Ketones, ur: NEGATIVE mg/dL
Nitrite: NEGATIVE
Protein, ur: NEGATIVE mg/dL
Specific Gravity, Urine: 1.021 (ref 1.005–1.030)
pH: 6 (ref 5.0–8.0)

## 2023-03-08 LAB — COMPREHENSIVE METABOLIC PANEL
ALT: 13 U/L (ref 0–44)
AST: 18 U/L (ref 15–41)
Albumin: 3 g/dL — ABNORMAL LOW (ref 3.5–5.0)
Alkaline Phosphatase: 82 U/L (ref 38–126)
Anion gap: 8 (ref 5–15)
BUN: 7 mg/dL (ref 6–20)
CO2: 21 mmol/L — ABNORMAL LOW (ref 22–32)
Calcium: 8.5 mg/dL — ABNORMAL LOW (ref 8.9–10.3)
Chloride: 104 mmol/L (ref 98–111)
Creatinine, Ser: 0.45 mg/dL (ref 0.44–1.00)
GFR, Estimated: >60 mL/min (ref >60)
Glucose, Bld: 93 mg/dL (ref 70–99)
Potassium: 3.7 mmol/L (ref 3.5–5.1)
Sodium: 133 mmol/L — ABNORMAL LOW (ref 135–145)
Total Bilirubin: 0.6 mg/dL (ref 0.3–1.2)
Total Protein: 6.7 g/dL (ref 6.5–8.1)

## 2023-03-08 LAB — GC/CHLAMYDIA PROBE AMP (~~LOC~~) NOT AT ARMC
Chlamydia: NEGATIVE
Comment: NEGATIVE
Comment: NORMAL
Neisseria Gonorrhea: NEGATIVE

## 2023-03-08 LAB — CBC
HCT: 29.3 % — ABNORMAL LOW (ref 36.0–46.0)
Hemoglobin: 9.7 g/dL — ABNORMAL LOW (ref 12.0–15.0)
MCH: 30.7 pg (ref 26.0–34.0)
MCHC: 33.1 g/dL (ref 30.0–36.0)
MCV: 92.7 fL (ref 80.0–100.0)
Platelets: 179 10*3/uL (ref 150–400)
RBC: 3.16 MIL/uL — ABNORMAL LOW (ref 3.87–5.11)
RDW: 12.4 % (ref 11.5–15.5)
WBC: 6.2 10*3/uL (ref 4.0–10.5)
nRBC: 0 % (ref 0.0–0.2)

## 2023-03-08 LAB — WET PREP, GENITAL
Clue Cells Wet Prep HPF POC: NONE SEEN
Sperm: NONE SEEN
Trich, Wet Prep: NONE SEEN
WBC, Wet Prep HPF POC: <10 (ref <10)
Yeast Wet Prep HPF POC: NONE SEEN

## 2023-03-08 LAB — FETAL FIBRONECTIN: Fetal Fibronectin: NEGATIVE

## 2023-03-08 LAB — LIPASE, BLOOD: Lipase: 26 U/L (ref 11–51)

## 2023-03-08 MED ORDER — LACTATED RINGERS IV BOLUS
1000.0000 mL | Freq: Once | INTRAVENOUS | Status: AC
  Administered 2023-03-08: 1000 mL via INTRAVENOUS

## 2023-03-08 MED ORDER — ALUM & MAG HYDROXIDE-SIMETH 200-200-20 MG/5ML PO SUSP
30.0000 mL | Freq: Once | ORAL | Status: AC
  Administered 2023-03-08: 30 mL via ORAL
  Filled 2023-03-08: qty 30

## 2023-03-08 NOTE — MAU Provider Note (Signed)
History    Chief Complaint  Patient presents with   Contractions   27yo 08/1009 at [redacted]w[redacted]d presenting for epigastric pain and contractions.   Reports chronic epigastric pain for which she takes pepcid and regular contractions for several days. Overnight tonight both her contractions and her epigastric pain worsened. Tried taking tylenol and pepcid without relief. Reports nausea and spitting, but no vomiting. Tolerating regular diet. Also has constipation. Denies fevers, chills, vaginal discharge, bleeding, LOF. Reports good fetal movement.   OBHX is notable for prior presentation w/ tPTL around 8 weeks ago. Exam at that time ft/50/ballotable, FFN negative. Her pregnancy is also c/b anemia.    Past Medical History:  Diagnosis Date   High risk medication use 06/06/2021   Language barrier 12/08/2020   Malaria 06/06/2021   UTI (urinary tract infection) during pregnancy 11/04/2022   Treated with Rocephin 2gm    Past Surgical History:  Procedure Laterality Date   NO PAST SURGERIES      Family History  Problem Relation Age of Onset   Hypertension Paternal Grandfather    Hyperlipidemia Maternal Aunt    Hypertension Paternal Aunt     Social History   Tobacco Use   Smoking status: Never   Smokeless tobacco: Never  Vaping Use   Vaping status: Never Used  Substance Use Topics   Alcohol use: Not Currently   Drug use: Never    Allergies: No Known Allergies  Medications Prior to Admission  Medication Sig Dispense Refill Last Dose   pantoprazole (PROTONIX) 20 MG tablet Take 1 tablet (20 mg total) by mouth daily. 30 tablet 1 03/07/2023   Prenatal Vit-Fe Phos-FA-Omega (VITAFOL GUMMIES) 3.33-0.333-34.8 MG CHEW Chew 3 tablets by mouth daily before breakfast. 90 tablet 11 03/07/2023   acetaminophen (TYLENOL) 500 MG tablet Take 500 mg by mouth every 6 (six) hours as needed for headache, fever or moderate pain. (Patient not taking: Reported on 11/14/2022)      famotidine (PEPCID) 20 MG  tablet Take 1 tablet (20 mg total) by mouth 2 (two) times daily as needed for heartburn or indigestion. 60 tablet 2    ferrous sulfate 325 (65 FE) MG tablet Take 1 tablet (325 mg total) by mouth every other day. 30 tablet 3    ondansetron (ZOFRAN-ODT) 4 MG disintegrating tablet Take 1 tablet (4 mg total) by mouth every 6 (six) hours as needed for nausea. (Patient not taking: Reported on 11/14/2022) 20 tablet 0    promethazine (PHENERGAN) 25 MG tablet Take 1 tablet (25 mg total) by mouth every 6 (six) hours as needed for nausea or vomiting. (Patient not taking: Reported on 11/14/2022) 30 tablet 0     Review of Systems  All other systems reviewed and are negative. For details, see HPI  Physical Exam Blood pressure (!) 99/54, pulse 84, temperature 98.4 F (36.9 C), resp. rate 20, height 5\' 2"  (1.575 m), weight 65.8 kg, last menstrual period 07/20/2022, SpO2 100%, not currently breastfeeding. Physical Exam Constitutional:      General: She is not in acute distress.    Appearance: She is not toxic-appearing.  Cardiovascular:     Rate and Rhythm: Normal rate.  Pulmonary:     Effort: Pulmonary effort is normal. No respiratory distress.  Abdominal:     Palpations: Abdomen is soft.     Tenderness: There is abdominal tenderness. There is no guarding or rebound.     Comments: Mild epigastric tenderness  Genitourinary:    General: Normal vulva.  Neurological:  Mental Status: She is alert.   SSE: cervix visually closed. Thin physiologic discharge, no bleeding SCE: 1/50/high  Results for orders placed or performed during the hospital encounter of 03/08/23 (from the past 24 hour(s))  Urinalysis, Routine w reflex microscopic -Urine, Clean Catch     Status: Abnormal   Collection Time: 03/08/23  1:00 AM  Result Value Ref Range   Color, Urine YELLOW YELLOW   APPearance HAZY (A) CLEAR   Specific Gravity, Urine 1.021 1.005 - 1.030   pH 6.0 5.0 - 8.0   Glucose, UA NEGATIVE NEGATIVE mg/dL   Hgb  urine dipstick NEGATIVE NEGATIVE   Bilirubin Urine NEGATIVE NEGATIVE   Ketones, ur NEGATIVE NEGATIVE mg/dL   Protein, ur NEGATIVE NEGATIVE mg/dL   Nitrite NEGATIVE NEGATIVE   Leukocytes,Ua TRACE (A) NEGATIVE   RBC / HPF 0-5 0 - 5 RBC/hpf   WBC, UA 0-5 0 - 5 WBC/hpf   Bacteria, UA RARE (A) NONE SEEN   Squamous Epithelial / HPF 11-20 0 - 5 /HPF   Mucus PRESENT   Fetal fibronectin     Status: None   Collection Time: 03/08/23  1:42 AM  Result Value Ref Range   Fetal Fibronectin NEGATIVE NEGATIVE  Wet prep, genital     Status: None   Collection Time: 03/08/23  1:42 AM  Result Value Ref Range   Yeast Wet Prep HPF POC NONE SEEN NONE SEEN   Trich, Wet Prep NONE SEEN NONE SEEN   Clue Cells Wet Prep HPF POC NONE SEEN NONE SEEN   WBC, Wet Prep HPF POC <10 <10   Sperm NONE SEEN   CBC     Status: Abnormal   Collection Time: 03/08/23  1:55 AM  Result Value Ref Range   WBC 6.2 4.0 - 10.5 K/uL   RBC 3.16 (L) 3.87 - 5.11 MIL/uL   Hemoglobin 9.7 (L) 12.0 - 15.0 g/dL   HCT 78.2 (L) 95.6 - 21.3 %   MCV 92.7 80.0 - 100.0 fL   MCH 30.7 26.0 - 34.0 pg   MCHC 33.1 30.0 - 36.0 g/dL   RDW 08.6 57.8 - 46.9 %   Platelets 179 150 - 400 K/uL   nRBC 0.0 0.0 - 0.2 %  Comprehensive metabolic panel     Status: Abnormal   Collection Time: 03/08/23  1:55 AM  Result Value Ref Range   Sodium 133 (L) 135 - 145 mmol/L   Potassium 3.7 3.5 - 5.1 mmol/L   Chloride 104 98 - 111 mmol/L   CO2 21 (L) 22 - 32 mmol/L   Glucose, Bld 93 70 - 99 mg/dL   BUN 7 6 - 20 mg/dL   Creatinine, Ser 6.29 0.44 - 1.00 mg/dL   Calcium 8.5 (L) 8.9 - 10.3 mg/dL   Total Protein 6.7 6.5 - 8.1 g/dL   Albumin 3.0 (L) 3.5 - 5.0 g/dL   AST 18 15 - 41 U/L   ALT 13 0 - 44 U/L   Alkaline Phosphatase 82 38 - 126 U/L   Total Bilirubin 0.6 0.3 - 1.2 mg/dL   GFR, Estimated >52 >84 mL/min   Anion gap 8 5 - 15  Lipase, blood     Status: None   Collection Time: 03/08/23  1:55 AM  Result Value Ref Range   Lipase 26 11 - 51 U/L   MAU  Course CBC c/w known anemia, no leukocytosis CMP with Na 133, otherwise normal  Lipase normal FFN negative NST reactive and reassuring Contractions spaced  s/p 1L LR Epigastric pain significantly improved after maalox & mylanta  A/P: 27yo 08/1009 at [redacted]w[redacted]d p/f preterm contractions and epigastric pain.  Labs reassuring. Symptoms improved with LR and maalox/mylanta FFN negative, low suspicion for active PTL at this time Reviewed OTC medications for epigastric pain (likely GERD) Reviewed strict PTL precautions  Dispo: home in good condition with ROB on 9/24  Harvie Bridge, MD Obstetrician & Gynecologist, [facility] for [employer], [facility]

## 2023-03-08 NOTE — MAU Note (Signed)
.  Linda Caldwell is a 27 y.o. at [redacted]w[redacted]d here in MAU reporting abdominal pain for 3 days but worse tonight. Denies VB or LOF. Reports good FM. States pain is cramping like contractions.   Onset of complaint: 3days Pain score: 10 Vitals:   03/08/23 0050 03/08/23 0052  BP:  (!) 99/54  Pulse: 84   Resp:  20  Temp: 98.4 F (36.9 C)   SpO2: 100%      FHT:156 Lab orders placed from triage:  u/a

## 2023-03-12 ENCOUNTER — Ambulatory Visit (INDEPENDENT_AMBULATORY_CARE_PROVIDER_SITE_OTHER): Payer: Medicaid Other | Admitting: Advanced Practice Midwife

## 2023-03-12 VITALS — BP 102/59 | HR 76 | Wt 148.4 lb

## 2023-03-12 DIAGNOSIS — N9489 Other specified conditions associated with female genital organs and menstrual cycle: Secondary | ICD-10-CM | POA: Diagnosis not present

## 2023-03-12 DIAGNOSIS — Z758 Other problems related to medical facilities and other health care: Secondary | ICD-10-CM

## 2023-03-12 DIAGNOSIS — Z3480 Encounter for supervision of other normal pregnancy, unspecified trimester: Secondary | ICD-10-CM

## 2023-03-12 DIAGNOSIS — Z3A33 33 weeks gestation of pregnancy: Secondary | ICD-10-CM

## 2023-03-12 DIAGNOSIS — Z603 Acculturation difficulty: Secondary | ICD-10-CM

## 2023-03-12 NOTE — Progress Notes (Signed)
   PRENATAL VISIT NOTE  Subjective:  Linda Caldwell is a 27 y.o. G3P1011 at [redacted]w[redacted]d being seen today for ongoing prenatal care.  She is currently monitored for the following issues for this low-risk pregnancy and has Supervision of other normal pregnancy, antepartum; History of malaria; Threatened premature labor; GERD (gastroesophageal reflux disease); and Anemia affecting pregnancy in third trimester on their problem list.  Patient reports  lower abdominal cramping, irregular .  Contractions: Irritability. Vag. Bleeding: None.  Movement: Present. Denies leaking of fluid.   The following portions of the patient's history were reviewed and updated as appropriate: allergies, current medications, past family history, past medical history, past social history, past surgical history and problem list.   Objective:   Vitals:   03/12/23 1416  BP: (!) 102/59  Pulse: 76  Weight: 148 lb 6.4 oz (67.3 kg)    Fetal Status: Fetal Heart Rate (bpm): 140   Movement: Present     General:  Alert, oriented and cooperative. Patient is in no acute distress.  Skin: Skin is warm and dry. No rash noted.   Cardiovascular: Normal heart rate noted  Respiratory: Normal respiratory effort, no problems with respiration noted  Abdomen: Soft, gravid, appropriate for gestational age.  Pain/Pressure: Present     Pelvic: Cervical exam deferred        Extremities: Normal range of motion.  Edema: None  Mental Status: Normal mood and affect. Normal behavior. Normal judgment and thought content.   Assessment and Plan:  Pregnancy: G3P1011 at [redacted]w[redacted]d 1. Supervision of other normal pregnancy, antepartum --Anticipatory guidance about next visits/weeks of pregnancy given.  --Interest in waterbirth, assisted pt to enroll in WB class today, pt husband is out of town but will return and can take class with patient due to language barrier.   2. [redacted] weeks gestation of pregnancy   3. Uterine cramping --Irregular, but  uncomfortable --Rest/ice/heat/warm bath/increase PO fluids/Tylenol/pregnancy support belt   - Culture, OB Urine - POCT Urinalysis Dipstick    4. Language barrier affecting health care --[name] interpreter used for all communication   Preterm labor symptoms and general obstetric precautions including but not limited to vaginal bleeding, contractions, leaking of fluid and fetal movement were reviewed in detail with the patient. Please refer to After Visit Summary for other counseling recommendations.   No follow-ups on file.  Future Appointments  Date Time Provider Department Center  03/26/2023  2:30 PM Hessie Dibble, MD [facility] None  04/02/2023  2:30 PM Joanne Gavel, MD [facility] None  04/09/2023  2:30 PM Leftwich-Kirby, Wilmer Floor, CNM [facility] None  04/16/2023  2:30 PM Constant, Gigi Gin, MD [facility] None    Sharen Counter, CNM

## 2023-03-14 LAB — CULTURE, OB URINE

## 2023-03-14 LAB — URINE CULTURE, OB REFLEX

## 2023-03-19 DIAGNOSIS — Z419 Encounter for procedure for purposes other than remedying health state, unspecified: Secondary | ICD-10-CM | POA: Diagnosis not present

## 2023-03-26 ENCOUNTER — Ambulatory Visit (INDEPENDENT_AMBULATORY_CARE_PROVIDER_SITE_OTHER): Payer: Medicaid Other | Admitting: Family Medicine

## 2023-03-26 VITALS — BP 99/57 | HR 73 | Wt 147.0 lb

## 2023-03-26 DIAGNOSIS — Z3A35 35 weeks gestation of pregnancy: Secondary | ICD-10-CM

## 2023-03-26 DIAGNOSIS — Z3480 Encounter for supervision of other normal pregnancy, unspecified trimester: Secondary | ICD-10-CM

## 2023-03-26 DIAGNOSIS — Z603 Acculturation difficulty: Secondary | ICD-10-CM

## 2023-03-26 DIAGNOSIS — N9489 Other specified conditions associated with female genital organs and menstrual cycle: Secondary | ICD-10-CM

## 2023-03-26 DIAGNOSIS — Z758 Other problems related to medical facilities and other health care: Secondary | ICD-10-CM

## 2023-03-26 NOTE — Progress Notes (Signed)
   PRENATAL VISIT NOTE  Subjective:  Linda Caldwell is a 27 y.o. G3P1011 at [redacted]w[redacted]d being seen today for ongoing prenatal care.  She is currently monitored for the following issues for this high-risk pregnancy and has Supervision of other normal pregnancy, antepartum; History of malaria; Threatened premature labor; GERD (gastroesophageal reflux disease); and Anemia affecting pregnancy in third trimester on their problem list.  Patient doing well with no acute concerns today. She reports  irregular braxton hicks contractions .  Contractions: Irritability. Vag. Bleeding: None.  Movement: Present. Denies leaking of fluid. Denies vaginal d/c, UTI symptoms, and endorses proper oral hydration.   The following portions of the patient's history were reviewed and updated as appropriate: allergies, current medications, past family history, past medical history, past social history, past surgical history and problem list. Problem list updated.  Objective:   Vitals:   03/26/23 1431  BP: (!) 99/57  Pulse: 73  Weight: 147 lb (66.7 kg)    Fetal Status: Fetal Heart Rate (bpm): 151 Fundal Height: 34 cm Movement: Present     General:  Alert, oriented and cooperative. Patient is in no acute distress.  Skin: Skin is warm and dry. No rash noted.   Cardiovascular: Normal heart rate noted  Respiratory: Normal respiratory effort, no problems with respiration noted  Abdomen: Soft, gravid, appropriate for gestational age.  Pain/Pressure: Absent     Pelvic: Cervical exam deferred        Extremities: Normal range of motion.  Edema: None  Mental Status:  Normal mood and affect. Normal behavior. Normal judgment and thought content.   Assessment and Plan:  Pregnancy: G3P1011 at [redacted]w[redacted]d  1. Supervision of other normal pregnancy, antepartum  2. [redacted] weeks gestation of pregnancy - ROB 2 weeks, GBS at that time  3. Uterine cramping - denies UTI symptoms, had thought she had UA done at prior visit but realized not  completed after patient had left.  - sent message to patient to possibly return for both UA and GC self swab to ensure not causing the cramping, orders placed and standing in case can do test later today - POC UA - GC swab   4. Language barrier affecting health care [name] interpreter used throughout encounter    Preterm labor symptoms and general obstetric precautions including but not limited to vaginal bleeding, contractions, leaking of fluid and fetal movement were reviewed in detail with the patient.  Please refer to After Visit Summary for other counseling recommendations.   Return in about 2 weeks (around 04/09/2023) for ROB, GBS .  Mittie Bodo, MD Family Medicine - Obstetrics Fellow

## 2023-03-26 NOTE — Progress Notes (Signed)
Pt presents for ROB visit. No concerns.  

## 2023-04-02 ENCOUNTER — Ambulatory Visit (INDEPENDENT_AMBULATORY_CARE_PROVIDER_SITE_OTHER): Payer: Medicaid Other | Admitting: Obstetrics and Gynecology

## 2023-04-02 ENCOUNTER — Encounter: Payer: Self-pay | Admitting: Obstetrics and Gynecology

## 2023-04-02 ENCOUNTER — Encounter: Payer: Medicaid Other | Admitting: Obstetrics and Gynecology

## 2023-04-02 VITALS — BP 102/69 | HR 84 | Wt 151.2 lb

## 2023-04-02 DIAGNOSIS — O99013 Anemia complicating pregnancy, third trimester: Secondary | ICD-10-CM

## 2023-04-02 DIAGNOSIS — Z3480 Encounter for supervision of other normal pregnancy, unspecified trimester: Secondary | ICD-10-CM | POA: Diagnosis not present

## 2023-04-02 DIAGNOSIS — O4703 False labor before 37 completed weeks of gestation, third trimester: Secondary | ICD-10-CM

## 2023-04-02 NOTE — Progress Notes (Signed)
   PRENATAL VISIT NOTE  Subjective:  Linda Caldwell is a 27 y.o. G3P1011 at [redacted]w[redacted]d being seen today for ongoing prenatal care.  She is currently monitored for the following issues for this low-risk pregnancy and has Supervision of other normal pregnancy, antepartum; History of malaria; Threatened premature labor; GERD (gastroesophageal reflux disease); and Anemia affecting pregnancy in third trimester on their problem list.  Patient reports occasional contractions.  Contractions: Not present. Vag. Bleeding: None.  Movement: Present. Denies leaking of fluid. Wanting to do waterbirth class.  The following portions of the patient's history were reviewed and updated as appropriate: allergies, current medications, past family history, past medical history, past social history, past surgical history and problem list.   Objective:   Vitals:   04/02/23 1438  BP: 102/69  Pulse: 84  Weight: 151 lb 3.2 oz (68.6 kg)    Fetal Status: Fetal Heart Rate (bpm): 159 Fundal Height: 36 cm Movement: Present     General:  Alert, oriented and cooperative. Patient is in no acute distress.  Skin: Skin is warm and dry. No rash noted.   Cardiovascular: Normal heart rate noted  Respiratory: Normal respiratory effort, no problems with respiration noted  Abdomen: Soft, gravid, appropriate for gestational age.  Pain/Pressure: Present (Pressure)     Pelvic: Normal external genitalia. GBS swab obtained in present of chaperone         Extremities: Normal range of motion.  Edema: None  Mental Status: Normal mood and affect. Normal behavior. Normal judgment and thought content.   Assessment and Plan:  Pregnancy: G3P1011 at [redacted]w[redacted]d 1. Supervision of other normal pregnancy, antepartum Doing well. Spoke with Misty Stanley, CNM, who will help get patient set up for a waterbirth class. No medical indication to preclude this mode of delivery at this time.  - Culture, beta strep (group b only)  2. Threatened premature labor in  third trimester No s/sx consistent with preterm labor at this time.  3. Anemia affecting pregnancy in third trimester Taking iron.  Patient's preferred language is [name]. Engineer, water used during patient interaction.  Preterm labor symptoms and general obstetric precautions including but not limited to vaginal bleeding, contractions, leaking of fluid and fetal movement were reviewed in detail with the patient. Please refer to After Visit Summary for other counseling recommendations.   Return in about 1 week (around 04/09/2023) for ROB.  Future Appointments  Date Time Provider Department Center  04/09/2023  2:30 PM Hurshel Party, CNM [facility] None  04/16/2023  2:30 PM Leftwich-Kirby, Wilmer Floor, CNM [facility] None  04/23/2023  2:30 PM Leftwich-Kirby, Wilmer Floor, CNM [facility] None  04/30/2023  2:30 PM Leftwich-Kirby, Wilmer Floor, CNM [facility] None    Joanne Gavel, MD OB Fellow

## 2023-04-04 ENCOUNTER — Encounter: Payer: Medicaid Other | Admitting: Obstetrics and Gynecology

## 2023-04-07 LAB — CULTURE, BETA STREP (GROUP B ONLY): Strep Gp B Culture: NEGATIVE

## 2023-04-09 ENCOUNTER — Encounter: Payer: Medicaid Other | Admitting: Advanced Practice Midwife

## 2023-04-09 ENCOUNTER — Ambulatory Visit (INDEPENDENT_AMBULATORY_CARE_PROVIDER_SITE_OTHER): Payer: Medicaid Other | Admitting: Advanced Practice Midwife

## 2023-04-09 VITALS — BP 106/58 | HR 107 | Wt 155.0 lb

## 2023-04-09 DIAGNOSIS — Z3A37 37 weeks gestation of pregnancy: Secondary | ICD-10-CM

## 2023-04-09 DIAGNOSIS — Z3480 Encounter for supervision of other normal pregnancy, unspecified trimester: Secondary | ICD-10-CM

## 2023-04-09 DIAGNOSIS — Z3483 Encounter for supervision of other normal pregnancy, third trimester: Secondary | ICD-10-CM

## 2023-04-09 NOTE — Progress Notes (Signed)
   PRENATAL VISIT NOTE  Subjective:  Linda Caldwell is a 27 y.o. G3P1011 at [redacted]w[redacted]d being seen today for ongoing prenatal care.  She is currently monitored for the following issues for this low-risk pregnancy and has Supervision of other normal pregnancy, antepartum; History of malaria; Threatened premature labor; GERD (gastroesophageal reflux disease); and Anemia affecting pregnancy in third trimester on their problem list.  Patient reports occasional contractions.  Contractions: Irritability. Vag. Bleeding: None.  Movement: Present. Denies leaking of fluid.   The following portions of the patient's history were reviewed and updated as appropriate: allergies, current medications, past family history, past medical history, past social history, past surgical history and problem list.   Objective:   Vitals:   04/09/23 1444  BP: (!) 106/58  Pulse: (!) 107  Weight: 155 lb (70.3 kg)    Fetal Status: Fetal Heart Rate (bpm): 130 Fundal Height: 37 cm Movement: Present     General:  Alert, oriented and cooperative. Patient is in no acute distress.  Skin: Skin is warm and dry. No rash noted.   Cardiovascular: Normal heart rate noted  Respiratory: Normal respiratory effort, no problems with respiration noted  Abdomen: Soft, gravid, appropriate for gestational age.  Pain/Pressure: Present     Pelvic: Cervical exam deferred        Extremities: Normal range of motion.  Edema: None  Mental Status: Normal mood and affect. Normal behavior. Normal judgment and thought content.   Assessment and Plan:  Pregnancy: G3P1011 at [redacted]w[redacted]d 1. Supervision of other normal pregnancy, antepartum --Anticipatory guidance about next visits/weeks of pregnancy given.  - Pt interested in waterbirth but was unable to attend the class.  Message sent to waterbirth class instructor but CNM discussed waterbirth over several visits so has received adequate waterbirth education.  I do not see this as a barrier to a waterbirth  if there are no other contraindications upon admission.   2. [redacted] weeks gestation of pregnancy   Term labor symptoms and general obstetric precautions including but not limited to vaginal bleeding, contractions, leaking of fluid and fetal movement were reviewed in detail with the patient. Please refer to After Visit Summary for other counseling recommendations.   Return in about 1 week (around 04/16/2023) for Midwife preferred, waterbirth.  Future Appointments  Date Time Provider Department Center  04/16/2023  2:30 PM Hurshel Party, CNM [facility] None  04/23/2023  2:30 PM Leftwich-Kirby, Wilmer Floor, CNM [facility] None  04/30/2023  2:30 PM Leftwich-Kirby, Wilmer Floor, CNM [facility] None    Sharen Counter, CNM

## 2023-04-09 NOTE — Progress Notes (Signed)
ROB 37.[redacted] wks GA No complaints Declines SVE today

## 2023-04-10 ENCOUNTER — Encounter: Payer: Medicaid Other | Admitting: Obstetrics and Gynecology

## 2023-04-16 ENCOUNTER — Ambulatory Visit (INDEPENDENT_AMBULATORY_CARE_PROVIDER_SITE_OTHER): Payer: Medicaid Other | Admitting: Advanced Practice Midwife

## 2023-04-16 ENCOUNTER — Encounter: Payer: Medicaid Other | Admitting: Obstetrics and Gynecology

## 2023-04-16 ENCOUNTER — Encounter: Payer: Medicaid Other | Admitting: Advanced Practice Midwife

## 2023-04-16 VITALS — BP 109/62 | HR 81 | Wt 156.6 lb

## 2023-04-16 DIAGNOSIS — R102 Pelvic and perineal pain: Secondary | ICD-10-CM

## 2023-04-16 DIAGNOSIS — Z3A38 38 weeks gestation of pregnancy: Secondary | ICD-10-CM

## 2023-04-16 DIAGNOSIS — Z3480 Encounter for supervision of other normal pregnancy, unspecified trimester: Secondary | ICD-10-CM

## 2023-04-16 DIAGNOSIS — O26893 Other specified pregnancy related conditions, third trimester: Secondary | ICD-10-CM

## 2023-04-16 NOTE — Progress Notes (Signed)
Pt presents for ROB visit. Pt c/o pain and pressure. Requesting cervical check.

## 2023-04-16 NOTE — Progress Notes (Signed)
   PRENATAL VISIT NOTE  Subjective:  Linda Caldwell is a 27 y.o. G3P1011 at 101w4d being seen today for ongoing prenatal care.  She is currently monitored for the following issues for this low-risk pregnancy and has Supervision of other normal pregnancy, antepartum; History of malaria; Threatened premature labor; GERD (gastroesophageal reflux disease); and Anemia affecting pregnancy in third trimester on their problem list.  Patient reports occasional contractions.  Contractions: Irritability. Vag. Bleeding: None.  Movement: Present. Denies leaking of fluid.   The following portions of the patient's history were reviewed and updated as appropriate: allergies, current medications, past family history, past medical history, past social history, past surgical history and problem list.   Objective:   Vitals:   04/16/23 1448  BP: 109/62  Pulse: 81  Weight: 156 lb 9.6 oz (71 kg)    Fetal Status: Fetal Heart Rate (bpm): 132   Movement: Present     General:  Alert, oriented and cooperative. Patient is in no acute distress.  Skin: Skin is warm and dry. No rash noted.   Cardiovascular: Normal heart rate noted  Respiratory: Normal respiratory effort, no problems with respiration noted  Abdomen: Soft, gravid, appropriate for gestational age.  Pain/Pressure: Present     Pelvic: Cervical exam performed in the presence of a chaperone        Extremities: Normal range of motion.  Edema: None  Mental Status: Normal mood and affect. Normal behavior. Normal judgment and thought content.   Assessment and Plan:  Pregnancy: G3P1011 at [redacted]w[redacted]d  1. Supervision of other normal pregnancy, antepartum --Anticipatory guidance about next visits/weeks of pregnancy given.  --Reviewed where to go in labor, pt plans waterbirth, no barriers at this time  2. [redacted] weeks gestation of pregnancy   3. Pelvic pressure in pregnancy, third trimester --Irregular --Reviewed s/sx of labor   Term labor symptoms and general  obstetric precautions including but not limited to vaginal bleeding, contractions, leaking of fluid and fetal movement were reviewed in detail with the patient. Please refer to After Visit Summary for other counseling recommendations.   Return in about 1 week (around 04/23/2023) for Midwife preferred.  Future Appointments  Date Time Provider Department Center  04/23/2023  2:30 PM Hurshel Party, CNM [facility] None  04/30/2023  2:30 PM Leftwich-Kirby, Wilmer Floor, CNM [facility] None    Sharen Counter, CNM

## 2023-04-19 DIAGNOSIS — Z419 Encounter for procedure for purposes other than remedying health state, unspecified: Secondary | ICD-10-CM | POA: Diagnosis not present

## 2023-04-23 ENCOUNTER — Encounter: Payer: Medicaid Other | Admitting: Advanced Practice Midwife

## 2023-04-30 ENCOUNTER — Other Ambulatory Visit: Payer: Self-pay

## 2023-04-30 ENCOUNTER — Encounter (HOSPITAL_COMMUNITY): Payer: Self-pay | Admitting: Obstetrics & Gynecology

## 2023-04-30 ENCOUNTER — Inpatient Hospital Stay (HOSPITAL_COMMUNITY)
Admission: RE | Admit: 2023-04-30 | Discharge: 2023-05-02 | DRG: 807 | Disposition: A | Discharge status: Home / Self Care | Payer: Medicaid Other | Attending: Obstetrics & Gynecology | Admitting: Obstetrics & Gynecology

## 2023-04-30 ENCOUNTER — Ambulatory Visit (INDEPENDENT_AMBULATORY_CARE_PROVIDER_SITE_OTHER): Payer: Medicaid Other | Admitting: Advanced Practice Midwife

## 2023-04-30 ENCOUNTER — Encounter: Payer: Medicaid Other | Admitting: Advanced Practice Midwife

## 2023-04-30 VITALS — BP 116/65 | HR 87 | Wt 157.4 lb

## 2023-04-30 DIAGNOSIS — Z3480 Encounter for supervision of other normal pregnancy, unspecified trimester: Secondary | ICD-10-CM

## 2023-04-30 DIAGNOSIS — K219 Gastro-esophageal reflux disease without esophagitis: Secondary | ICD-10-CM | POA: Diagnosis present

## 2023-04-30 DIAGNOSIS — Z603 Acculturation difficulty: Secondary | ICD-10-CM

## 2023-04-30 DIAGNOSIS — O480 Post-term pregnancy: Secondary | ICD-10-CM | POA: Diagnosis present

## 2023-04-30 DIAGNOSIS — Z3A40 40 weeks gestation of pregnancy: Secondary | ICD-10-CM

## 2023-04-30 DIAGNOSIS — Z30017 Encounter for initial prescription of implantable subdermal contraceptive: Secondary | ICD-10-CM

## 2023-04-30 DIAGNOSIS — O368130 Decreased fetal movements, third trimester, not applicable or unspecified: Principal | ICD-10-CM | POA: Diagnosis present

## 2023-04-30 DIAGNOSIS — O9902 Anemia complicating childbirth: Secondary | ICD-10-CM | POA: Diagnosis present

## 2023-04-30 DIAGNOSIS — O368190 Decreased fetal movements, unspecified trimester, not applicable or unspecified: Secondary | ICD-10-CM | POA: Diagnosis present

## 2023-04-30 DIAGNOSIS — Z8249 Family history of ischemic heart disease and other diseases of the circulatory system: Secondary | ICD-10-CM

## 2023-04-30 DIAGNOSIS — Z3483 Encounter for supervision of other normal pregnancy, third trimester: Secondary | ICD-10-CM

## 2023-04-30 DIAGNOSIS — O9962 Diseases of the digestive system complicating childbirth: Secondary | ICD-10-CM | POA: Diagnosis not present

## 2023-04-30 DIAGNOSIS — Z8613 Personal history of malaria: Secondary | ICD-10-CM | POA: Diagnosis not present

## 2023-04-30 DIAGNOSIS — Z758 Other problems related to medical facilities and other health care: Secondary | ICD-10-CM

## 2023-04-30 LAB — CBC
HCT: 30.6 % — ABNORMAL LOW (ref 36.0–46.0)
Hemoglobin: 9.9 g/dL — ABNORMAL LOW (ref 12.0–15.0)
MCH: 28.8 pg (ref 26.0–34.0)
MCHC: 32.4 g/dL (ref 30.0–36.0)
MCV: 89 fL (ref 80.0–100.0)
Platelets: 237 10*3/uL (ref 150–400)
RBC: 3.44 MIL/uL — ABNORMAL LOW (ref 3.87–5.11)
RDW: 14 % (ref 11.5–15.5)
WBC: 7.1 10*3/uL (ref 4.0–10.5)
nRBC: 0 % (ref 0.0–0.2)

## 2023-04-30 LAB — TYPE AND SCREEN
ABO/RH(D): A POS
Antibody Screen: NEGATIVE

## 2023-04-30 MED ORDER — LIDOCAINE HCL (PF) 1 % IJ SOLN: 30.0000 mL | INTRAMUSCULAR | Status: DC | PRN

## 2023-04-30 MED ORDER — ACETAMINOPHEN 325 MG PO TABS
650.0000 mg | ORAL_TABLET | ORAL | Status: DC | PRN
Start: 2023-04-30 — End: 2023-05-01

## 2023-04-30 MED ORDER — OXYCODONE-ACETAMINOPHEN 5-325 MG PO TABS: 2.0000 | ORAL_TABLET | ORAL | Status: DC | PRN

## 2023-04-30 MED ORDER — LACTATED RINGERS IV SOLN: 500.0000 mL | INTRAVENOUS | Status: DC | PRN

## 2023-04-30 MED ORDER — ONDANSETRON HCL 4 MG/2ML IJ SOLN: 4.0000 mg | Freq: Four times a day (QID) | INTRAMUSCULAR | Status: DC | PRN

## 2023-04-30 MED ORDER — LACTATED RINGERS IV SOLN: INTRAVENOUS | Status: DC

## 2023-04-30 MED ORDER — OXYCODONE-ACETAMINOPHEN 5-325 MG PO TABS: 1.0000 | ORAL_TABLET | ORAL | Status: DC | PRN

## 2023-04-30 MED ORDER — OXYTOCIN BOLUS FROM INFUSION
333.0000 mL | Freq: Once | INTRAVENOUS | Status: AC
  Administered 2023-05-01: 333 mL via INTRAVENOUS

## 2023-04-30 MED ORDER — SOD CITRATE-CITRIC ACID 500-334 MG/5ML PO SOLN
30.0000 mL | ORAL | Status: DC | PRN
Start: 2023-04-30 — End: 2023-05-01

## 2023-04-30 MED ORDER — OXYTOCIN-SODIUM CHLORIDE 30-0.9 UT/500ML-% IV SOLN
2.5000 [IU]/h | INTRAVENOUS | Status: DC
Start: 2023-04-30 — End: 2023-05-01

## 2023-04-30 NOTE — Progress Notes (Addendum)
   PRENATAL VISIT NOTE  Subjective:  Linda Caldwell is a 27 y.o. G3P1011 at [redacted]w[redacted]d being seen today for ongoing prenatal care.  She is currently monitored for the following issues for this low-risk pregnancy and has Supervision of other normal pregnancy, antepartum; History of malaria; Threatened premature labor; GERD (gastroesophageal reflux disease); and Anemia affecting pregnancy in third trimester on their problem list.  Patient reports  decreased fetal movement yesterday and today .  Contractions: Irregular. Vag. Bleeding: None.  Movement: (!) Decreased. Denies leaking of fluid.   The following portions of the patient's history were reviewed and updated as appropriate: allergies, current medications, past family history, past medical history, past social history, past surgical history and problem list.   Objective:   Vitals:   04/30/23 1438  BP: 116/65  Pulse: 87  Weight: 157 lb 6.4 oz (71.4 kg)    Fetal Status: Fetal Heart Rate (bpm): 135 Fundal Height: 38 cm Movement: (!) Decreased     General:  Alert, oriented and cooperative. Patient is in no acute distress.  Skin: Skin is warm and dry. No rash noted.   Cardiovascular: Normal heart rate noted  Respiratory: Normal respiratory effort, no problems with respiration noted  Abdomen: Soft, gravid, appropriate for gestational age.  Pain/Pressure: Present     Pelvic: Cervical exam performed in the presence of a chaperone Dilation: 3 Effacement (%): 50 Station: -2  Extremities: Normal range of motion.  Edema: None  Mental Status: Normal mood and affect. Normal behavior. Normal judgment and thought content.   Assessment and Plan:  Pregnancy: G3P1011 at [redacted]w[redacted]d 1. Supervision of other normal pregnancy, antepartum --Anticipatory guidance about next visits/weeks of pregnancy given.   2. [redacted] weeks gestation of pregnancy --NST reactive  3. Decreased fetal movements in third trimester, single or unspecified fetus --Pt with persistent  DFM x 24 hours.  Consult Dr Macon Large and recommended admission for IOL since pt is postdates.  --discussed how DFM may risk pt out of waterbirth, but that extended monitoring may be performed and hydrotherapy in shower or tub may be available later.  L&D team to decide. Pt states understanding.  --Membranes swept at pt request, cervix 3/50/-2, favorable for IOL with Pitocin and/or AROM, which was discussed in the office today.  --Pt to go home to pick up her husband and go straight to the hospital.  --Orders for admission placed  4. Language barrier affecting health care --[name] interpreter present for all communication. Pt able to ask questions and communicate with provider in English but interpreter used for clarification, explanation of medical terms/procedures, etc.   Term labor symptoms and general obstetric precautions including but not limited to vaginal bleeding, contractions, leaking of fluid and fetal movement were reviewed in detail with the patient. Please refer to After Visit Summary for other counseling recommendations.   No follow-ups on file.  Future Appointments  Date Time Provider Department Center  05/03/2023  7:00 AM [facility] SCHED ROOM [facility] None    Sharen Counter, CNM

## 2023-04-30 NOTE — H&P (Addendum)
OBSTETRIC ADMISSION HISTORY AND PHYSICAL  Hera Norton is a 27 y.o. female G43P1011 with IUP at [redacted]w[redacted]d (dated by LMP, Estimated Date of Delivery: 04/26/23) presenting for induction of labor due to decreased fetal movement in setting of [redacted] weeks gestation.   She reports mildly decreased FM, otherwise no LOF, no VB, no blurry vision, headaches or peripheral edema, and RUQ pain.    She plans on formula feeding. She is undecided on contraception.  She received her prenatal care at  [facility]    Prenatal History/Complications:  - GERD - anemia of pregnancy - hx malaria  Past Medical History: Past Medical History:  Diagnosis Date   High risk medication use 06/06/2021   Language barrier 12/08/2020   Malaria 06/06/2021   UTI (urinary tract infection) during pregnancy 11/04/2022   Treated with Rocephin 2gm    Past Surgical History: Past Surgical History:  Procedure Laterality Date   NO PAST SURGERIES      Obstetrical History: OB History     Gravida  3   Para  1   Term  1   Preterm  0   AB  1   Living  1      SAB  1   IAB  0   Ectopic  0   Multiple  0   Live Births  1           Social History Social History   Socioeconomic History   Marital status: Married    Spouse name: Not on file   Number of children: 0   Years of education: Not on file   Highest education level: Not on file  Occupational History   Not on file  Tobacco Use   Smoking status: Never   Smokeless tobacco: Never  Vaping Use   Vaping status: Never Used  Substance and Sexual Activity   Alcohol use: Not Currently   Drug use: Never   Sexual activity: Yes  Other Topics Concern   Not on file  Social History Narrative   Not on file   Social Determinants of Health   Financial Resource Strain: Not on file  Food Insecurity: No Food Insecurity (04/30/2023)   Hunger Vital Sign    Worried About Running Out of Food in the Last Year: Never true    Ran Out of Food in the Last Year:  Never true  Transportation Needs: No Transportation Needs (04/30/2023)   PRAPARE - Administrator, Civil Service (Medical): No    Lack of Transportation (Non-Medical): No  Physical Activity: Not on file  Stress: Not on file  Social Connections: Not on file    Family History: Family History  Problem Relation Age of Onset   Hypertension Paternal Grandfather    Hyperlipidemia Maternal Aunt    Hypertension Paternal Aunt     Allergies: No Known Allergies  Medications Prior to Admission  Medication Sig Dispense Refill Last Dose   famotidine (PEPCID) 20 MG tablet Take 1 tablet (20 mg total) by mouth 2 (two) times daily as needed for heartburn or indigestion. 60 tablet 2    ferrous sulfate 325 (65 FE) MG tablet Take 1 tablet (325 mg total) by mouth every other day. 30 tablet 3    pantoprazole (PROTONIX) 20 MG tablet Take 1 tablet (20 mg total) by mouth daily. 30 tablet 1    Prenatal Vit-Fe Phos-FA-Omega (VITAFOL GUMMIES) 3.33-0.333-34.8 MG CHEW Chew 3 tablets by mouth daily before breakfast. 90 tablet 11  Review of Systems  All systems reviewed and negative except as stated in HPI.  Blood pressure 121/65, pulse 85, temperature 99.1 F (37.3 C), temperature source Oral, resp. rate 16, height 5' 2.21" (1.58 m), weight 71.4 kg, last menstrual period 07/20/2022, not currently breastfeeding. General appearance: alert and cooperative Lungs: breathing comfortably on room air Heart: regular rate Abdomen: soft, non-tender; gravid Extremities: no edema of bilateral lower extremities Presentation: cephalic Fetal monitoring: 130/mod/+a/-d Uterine activity: every 2-7 min Dilation: 3 Effacement (%): 60 Station: -3 Exam by:: Dr. Lucianne Muss   Prenatal labs: ABO, Rh: --/--/A POS (11/12 1935) Antibody: NEG (11/12 1935) Rubella: 20.80 (04/03 1418) RPR: Non Reactive (08/19 0909)  HBsAg: Negative (04/03 1418)  HIV: Non Reactive (08/19 0909)  GBS: Negative/-- (10/15 1508)  2 hr  Glucola wnl Genetic screening low risk, female Anatomy US 19 week [place] - normal anatomy Last [place]: At [redacted]w[redacted]d - cephalic presentation, EFW 261 (37 %tile), AC 52%tile  Prenatal Transfer Tool  Maternal Diabetes: No Genetic Screening: Normal Maternal Ultrasounds/Referrals: Normal Fetal Ultrasounds or other Referrals:  None Maternal Substance Abuse:  No Significant Maternal Medications:  Meds include: Other: Pepcid, Protonix Significant Maternal Lab Results:  Group B Strep negative Number of Prenatal Visits:greater than 3 verified prenatal visits Other Comments:  None  Results for orders placed or performed during the hospital encounter of 04/30/23 (from the past 24 hour(s))  CBC   Collection Time: 04/30/23  7:32 PM  Result Value Ref Range   WBC 7.1 4.0 - 10.5 K/uL   RBC 3.44 (L) 3.87 - 5.11 MIL/uL   Hemoglobin 9.9 (L) 12.0 - 15.0 g/dL   HCT 53.6 (L) 64.4 - 03.4 %   MCV 89.0 80.0 - 100.0 fL   MCH 28.8 26.0 - 34.0 pg   MCHC 32.4 30.0 - 36.0 g/dL   RDW 74.2 59.5 - 63.8 %   Platelets 237 150 - 400 K/uL   nRBC 0.0 0.0 - 0.2 %  Type and screen   Collection Time: 04/30/23  7:35 PM  Result Value Ref Range   ABO/RH(D) A POS    Antibody Screen NEG    Sample Expiration      05/03/2023,2359 Performed at [facility], [address] [place], [place] [address]     Patient Active Problem List   Diagnosis Date Noted   Decreased fetal movement 04/30/2023   Anemia affecting pregnancy in third trimester 02/09/2023   Threatened premature labor 01/09/2023   GERD (gastroesophageal reflux disease) 01/09/2023   History of malaria 11/29/2022   Supervision of other normal pregnancy, antepartum 09/19/2022    Assessment/Plan:  Enisa Roysdon is a 27 y.o. G3P1011 at [redacted]w[redacted]d here for IOL secondary to DFM  #Labor: Pt 3/50/-2 in clinic  s/p AROM with mild clear fluid  will cont to monitor EFM, will discuss water birth with CNM and determine if able to offer #Pain: Desires  unmedicated #FWB: Cat I #ID:  GBS negative #MOF: Formula feeding #MOC: Nexplanon #Circ:  N/A  Sundra Aland, MD OB Fellow, Faculty Practice [facility], Center for [facility] 04/30/23 8:29 PM

## 2023-04-30 NOTE — Progress Notes (Signed)
Pt presents for ROB visit. Pt reports decreased fetal movement since yesterday and contractions. Requesting cervical check

## 2023-05-01 ENCOUNTER — Encounter (HOSPITAL_COMMUNITY): Payer: Self-pay | Admitting: Obstetrics & Gynecology

## 2023-05-01 ENCOUNTER — Inpatient Hospital Stay (HOSPITAL_COMMUNITY): Payer: Medicaid Other | Admitting: Anesthesiology

## 2023-05-01 DIAGNOSIS — Z3A40 40 weeks gestation of pregnancy: Secondary | ICD-10-CM

## 2023-05-01 DIAGNOSIS — O368130 Decreased fetal movements, third trimester, not applicable or unspecified: Secondary | ICD-10-CM

## 2023-05-01 DIAGNOSIS — O480 Post-term pregnancy: Secondary | ICD-10-CM

## 2023-05-01 LAB — RPR: RPR Ser Ql: NONREACTIVE

## 2023-05-01 MED ORDER — FENTANYL-BUPIVACAINE-NACL 0.5-0.125-0.9 MG/250ML-% EP SOLN
12.0000 mL/h | EPIDURAL | Status: DC | PRN
  Administered 2023-05-01: 12 mL/h via EPIDURAL
  Filled 2023-05-01: qty 250

## 2023-05-01 MED ORDER — WITCH HAZEL-GLYCERIN EX PADS: 1.0000 | MEDICATED_PAD | CUTANEOUS | Status: DC | PRN

## 2023-05-01 MED ORDER — SODIUM CHLORIDE 0.9% FLUSH: 10.0000 mL | Freq: Two times a day (BID) | INTRAVENOUS | Status: DC

## 2023-05-01 MED ORDER — SENNOSIDES-DOCUSATE SODIUM 8.6-50 MG PO TABS
2.0000 | ORAL_TABLET | ORAL | Status: DC
  Administered 2023-05-01 – 2023-05-02 (×2): 2 via ORAL
  Filled 2023-05-01 (×3): qty 2

## 2023-05-01 MED ORDER — DIBUCAINE (PERIANAL) 1 % EX OINT: 1.0000 | TOPICAL_OINTMENT | CUTANEOUS | Status: DC | PRN

## 2023-05-01 MED ORDER — IBUPROFEN 600 MG PO TABS
600.0000 mg | ORAL_TABLET | Freq: Four times a day (QID) | ORAL | Status: DC
  Administered 2023-05-01 – 2023-05-02 (×5): 600 mg via ORAL
  Filled 2023-05-01 (×5): qty 1

## 2023-05-01 MED ORDER — EPHEDRINE 5 MG/ML INJ: 10.0000 mg | INTRAVENOUS | Status: DC | PRN

## 2023-05-01 MED ORDER — DIPHENHYDRAMINE HCL 50 MG/ML IJ SOLN: 12.5000 mg | INTRAMUSCULAR | Status: DC | PRN

## 2023-05-01 MED ORDER — ONDANSETRON HCL 4 MG/2ML IJ SOLN: 4.0000 mg | INTRAMUSCULAR | Status: DC | PRN

## 2023-05-01 MED ORDER — LIDOCAINE-EPINEPHRINE (PF) 2 %-1:200000 IJ SOLN
INTRAMUSCULAR | Status: DC | PRN
  Administered 2023-05-01: 3 mL via EPIDURAL

## 2023-05-01 MED ORDER — TETANUS-DIPHTH-ACELL PERTUSSIS 5-2.5-18.5 LF-MCG/0.5 IM SUSY
0.5000 mL | PREFILLED_SYRINGE | Freq: Once | INTRAMUSCULAR | Status: DC
Start: 2023-05-02 — End: 2023-05-02

## 2023-05-01 MED ORDER — ONDANSETRON HCL 4 MG PO TABS: 4.0000 mg | ORAL_TABLET | ORAL | Status: DC | PRN

## 2023-05-01 MED ORDER — DIPHENHYDRAMINE HCL 25 MG PO CAPS: 25.0000 mg | ORAL_CAPSULE | Freq: Four times a day (QID) | ORAL | Status: DC | PRN

## 2023-05-01 MED ORDER — MEASLES, MUMPS & RUBELLA VAC IJ SOLR: 0.5000 mL | Freq: Once | INTRAMUSCULAR | Status: DC

## 2023-05-01 MED ORDER — PRENATAL MULTIVITAMIN CH
1.0000 | ORAL_TABLET | Freq: Every day | ORAL | Status: DC
  Administered 2023-05-01 – 2023-05-02 (×2): 1 via ORAL
  Filled 2023-05-01 (×2): qty 1

## 2023-05-01 MED ORDER — PHENYLEPHRINE 80 MCG/ML (10ML) SYRINGE FOR IV PUSH (FOR BLOOD PRESSURE SUPPORT)
80.0000 ug | PREFILLED_SYRINGE | INTRAVENOUS | Status: DC | PRN
  Filled 2023-05-01: qty 10

## 2023-05-01 MED ORDER — PHENYLEPHRINE 80 MCG/ML (10ML) SYRINGE FOR IV PUSH (FOR BLOOD PRESSURE SUPPORT): 80.0000 ug | PREFILLED_SYRINGE | INTRAVENOUS | Status: DC | PRN

## 2023-05-01 MED ORDER — SODIUM CHLORIDE 0.9% FLUSH: 3.0000 mL | Freq: Two times a day (BID) | INTRAVENOUS | Status: DC

## 2023-05-01 MED ORDER — SODIUM CHLORIDE 0.9% FLUSH: 3.0000 mL | INTRAVENOUS | Status: DC | PRN

## 2023-05-01 MED ORDER — LACTATED RINGERS IV SOLN: 500.0000 mL | Freq: Once | INTRAVENOUS | Status: DC

## 2023-05-01 MED ORDER — ZOLPIDEM TARTRATE 5 MG PO TABS: 5.0000 mg | ORAL_TABLET | Freq: Every evening | ORAL | Status: DC | PRN

## 2023-05-01 MED ORDER — INFLUENZA VIRUS VACC SPLIT PF (FLUZONE) 0.5 ML IM SUSY: 0.5000 mL | PREFILLED_SYRINGE | INTRAMUSCULAR | Status: DC

## 2023-05-01 MED ORDER — SIMETHICONE 80 MG PO CHEW: 80.0000 mg | CHEWABLE_TABLET | ORAL | Status: DC | PRN

## 2023-05-01 MED ORDER — LACTATED RINGERS IV SOLN: 500.0000 mL | Freq: Once | INTRAVENOUS | Status: AC

## 2023-05-01 MED ORDER — COCONUT OIL OIL: 1.0000 | TOPICAL_OIL | Status: DC | PRN

## 2023-05-01 MED ORDER — ACETAMINOPHEN 325 MG PO TABS: 650.0000 mg | ORAL_TABLET | ORAL | Status: DC | PRN

## 2023-05-01 MED ORDER — BENZOCAINE-MENTHOL 20-0.5 % EX AERO: 1.0000 | INHALATION_SPRAY | CUTANEOUS | Status: DC | PRN

## 2023-05-01 NOTE — Lactation Note (Signed)
This note was copied from a baby's chart. Lactation Consultation Note  Patient Name: Linda Caldwell [id number] Date: 05/01/2023 Age:25 hours Reason for consult: Initial assessment;Term Video interpreter Francesco Runner [phone]) used for communication, but MOB was able to communicate on her own for most of the conversation.  P2- MOB plans to both breastfeed and supplement with formula due to a hx of low supply. MOB reports that infant has nursed well on the breast so far. LC encouraged MOB to always place infant to the breast first before offering supplementation to help stimulate her milk supply. MOB verbalized understanding and agreement. MOB denies having any questions or concerns at this time.  LC reviewed feeding infant on cue 8-12x in 24 hrs, not allowing infant to go over 3 hrs without a feeding, offering breast first before supplementation, CDC milk storage guidelines and LC services handout. LC encouraged MOB to call for further lactation support as needed. [place] sent WIC referral to [facility] a pump.  Maternal Data Does the patient have breastfeeding experience prior to this delivery?: Yes How long did the patient breastfeed?: 1 year  Feeding Mother's Current Feeding Choice: Breast Milk and Formula Nipple Type: Slow - flow  Interventions Interventions: Breast feeding basics reviewed;Education;LC Services brochure  Discharge Discharge Education: Warning signs for feeding baby WIC Program: Yes  Consult Status Consult Status: Follow-up Date: 05/02/23 Follow-up type: In-patient    Dema Severin BS, IBCLC 05/01/2023, 12:47 PM

## 2023-05-01 NOTE — Anesthesia Preprocedure Evaluation (Signed)
Anesthesia Evaluation  Patient identified by MRN, date of birth, ID band Patient awake    Reviewed: Allergy & Precautions, Patient's Chart, lab work & pertinent test results  Airway Mallampati: I       Dental no notable dental hx.    Pulmonary    Pulmonary exam normal        Cardiovascular Normal cardiovascular exam     Neuro/Psych    GI/Hepatic ,GERD  ,,  Endo/Other    Renal/GU      Musculoskeletal   Abdominal   Peds  Hematology  (+) Blood dyscrasia, anemia   Anesthesia Other Findings   Reproductive/Obstetrics (+) Pregnancy                             Anesthesia Physical Anesthesia Plan  ASA: 2  Anesthesia Plan: Epidural   Post-op Pain Management:    Induction:   PONV Risk Score and Plan:   Airway Management Planned:   Additional Equipment:   Intra-op Plan:   Post-operative Plan:   Informed Consent: I have reviewed the patients History and Physical, chart, labs and discussed the procedure including the risks, benefits and alternatives for the proposed anesthesia with the patient or authorized representative who has indicated his/her understanding and acceptance.     Interpreter used for interview  Plan Discussed with:   Anesthesia Plan Comments: (Lab Results      Component                Value               Date                      WBC                      7.1                 04/30/2023                HGB                      9.9 (L)             04/30/2023                HCT                      30.6 (L)            04/30/2023                MCV                      89.0                04/30/2023                PLT                      237                 04/30/2023           )       Anesthesia Quick Evaluation

## 2023-05-01 NOTE — Discharge Summary (Signed)
Postpartum Discharge Summary  Date of Service updated 05/02/23     Patient Name: Linda Caldwell DOB: 08-29-1995 MRN: [id number]  Date of admission: 04/30/2023 Delivery date:05/01/2023 Delivering provider: Wyn Forster Date of discharge: 05/02/2023  Admitting diagnosis: Decreased fetal movement [O36.8190] Intrauterine pregnancy: [redacted]w[redacted]d     Secondary diagnosis:  Principal Problem:   Decreased fetal movement Active Problems:   NSVD (normal spontaneous vaginal delivery)  Additional problems: none    Discharge diagnosis: Term Pregnancy Delivered                                              Post partum procedures: none Augmentation: AROM Complications: None  Hospital course: Induction of Labor With Vaginal Delivery   27 y.o. yo [id number] at [redacted]w[redacted]d was admitted to the hospital 04/30/2023 for induction of labor.  Indication for induction:  DFM .  Patient had an labor course complicated by poor translator services.  Membrane Rupture Time/Date: 7:49 PM,04/30/2023  Delivery Method:Vaginal, Spontaneous Operative Delivery:N/A Episiotomy: None Lacerations:  None Details of delivery can be found in separate delivery note.  Patient had an uncomplicated postpartum course Patient is discharged home 05/02/23.  Newborn Data: Birth date:05/01/2023 Birth time:6:10 AM Gender:Female Living status:Living Apgars:8 ,9  Weight:3300 g  Magnesium Sulfate received: No BMZ received: No Rhophylac:N/A MMR:N/A T-DaP:Given postpartum Flu: Yes RSV Vaccine received: No Transfusion:No  Immunizations received: Immunization History  Administered Date(s) Administered   Influenza,inj,Quad PF,6+ Mos 05/22/2021   Tdap 06/16/2021    Physical exam  Vitals:   05/01/23 1300 05/01/23 1700 05/01/23 2100 05/02/23 0533  BP: 116/66 (!) 112/57 109/65 106/68  Pulse: 66 72 77 72  Resp: 16 16 16 16   Temp: 98.7 F (37.1 C) 98.4 F (36.9 C) 98.2 F (36.8 C) 98 F (36.7 C)  TempSrc: Oral Oral Oral Oral   SpO2: 100% 100%    Weight:      Height:       General: alert, cooperative, and no distress Lochia: appropriate Uterine Fundus: firm Incision: N/A DVT Evaluation: No evidence of DVT seen on physical exam. Labs: Lab Results  Component Value Date   WBC 7.1 04/30/2023   HGB 9.9 (L) 04/30/2023   HCT 30.6 (L) 04/30/2023   MCV 89.0 04/30/2023   PLT 237 04/30/2023      Latest Ref Rng & Units 03/08/2023    1:55 AM  CMP  Glucose 70 - 99 mg/dL 93   BUN 6 - 20 mg/dL 7   Creatinine 2.44 - 0.10 mg/dL 2.72   Sodium 536 - 644 mmol/L 133   Potassium 3.5 - 5.1 mmol/L 3.7   Chloride 98 - 111 mmol/L 104   CO2 22 - 32 mmol/L 21   Calcium 8.9 - 10.3 mg/dL 8.5   Total Protein 6.5 - 8.1 g/dL 6.7   Total Bilirubin 0.3 - 1.2 mg/dL 0.6   Alkaline Phos 38 - 126 U/L 82   AST 15 - 41 U/L 18   ALT 0 - 44 U/L 13    Edinburgh Score:    05/01/2023    3:25 PM  Edinburgh Postnatal Depression Scale Screening Tool  I have been able to laugh and see the funny side of things. 0  I have looked forward with enjoyment to things. 0  I have blamed myself unnecessarily when things went wrong. 1  I have been anxious or worried  for no good reason. 2  I have felt scared or panicky for no good reason. 0  Things have been getting on top of me. 1  I have been so unhappy that I have had difficulty sleeping. 0  I have felt sad or miserable. 0  I have been so unhappy that I have been crying. 0  The thought of harming myself has occurred to me. 0  Edinburgh Postnatal Depression Scale Total 4   Edinburgh Postnatal Depression Scale Total: 4   After visit meds:  Allergies as of 05/02/2023   No Known Allergies      Medication List     STOP taking these medications    famotidine 20 MG tablet Commonly known as: Pepcid   pantoprazole 20 MG tablet Commonly known as: PROTONIX       TAKE these medications    ferrous sulfate 325 (65 FE) MG tablet Take 1 tablet (325 mg total) by mouth every other day.    ibuprofen 600 MG tablet Commonly known as: ADVIL Take 1 tablet (600 mg total) by mouth every 6 (six) hours.   Vitafol Gummies 3.33-0.333-34.8 MG Chew Chew 3 tablets by mouth daily before breakfast.         Discharge home in stable condition Infant Feeding: Bottle Infant Disposition:home with mother Discharge instruction: per After Visit Summary and Postpartum booklet. Activity: Advance as tolerated. Pelvic rest for 6 weeks.  Diet: routine diet Future Appointments: Future Appointments  Date Time Provider Department Center  05/13/2023  1:00 PM Russ Halo, [place] [facility] None  06/21/2023 10:15 AM Sue Lush, FNP [facility] None   Follow up Visit:  Follow-up Information     [facility] Follow up in 6 day(s).   Specialty: Obstetrics and Gynecology Contact information: [address] [place] [address] [phone]               Message sent to [facility] 11/13 by Dr. Leanora Cover  Please schedule this patient for a In person postpartum visit in 6 weeks with the following provider: Any provider. Additional Postpartum F/U:Postpartum Depression checkup  Low risk pregnancy complicated by:  [name] speaking Delivery mode:  Vaginal, Spontaneous Anticipated Birth Control:  Nexplanon inpatient   05/02/2023 Sharen Counter, CNM

## 2023-05-01 NOTE — Anesthesia Procedure Notes (Signed)
Epidural Patient location during procedure: OB Start time: 05/01/2023 4:52 AM End time: 05/01/2023 4:57 AM  Staffing Anesthesiologist: Shelton Silvas, MD Performed: anesthesiologist   Preanesthetic Checklist Completed: patient identified, IV checked, site marked, risks and benefits discussed, surgical consent, monitors and equipment checked, pre-op evaluation and timeout performed  Epidural Patient position: sitting Prep: DuraPrep Patient monitoring: heart rate, continuous pulse ox and blood pressure Approach: midline Location: L3-L4 Injection technique: LOR saline  Needle:  Needle type: Tuohy  Needle gauge: 17 G Needle length: 9 cm Catheter type: closed end flexible Catheter size: 20 Guage Test dose: negative and 1.5% lidocaine  Assessment Events: blood not aspirated, no cerebrospinal fluid, injection not painful, no injection resistance and no paresthesia  Additional Notes LOR @ 5  Patient identified. Risks/Benefits/Options discussed with patient including but not limited to bleeding, infection, nerve damage, paralysis, failed block, incomplete pain control, headache, blood pressure changes, nausea, vomiting, reactions to medications, itching and postpartum back pain. Confirmed with bedside nurse the patient's most recent platelet count. Confirmed with patient that they are not currently taking any anticoagulation, have any bleeding history or any family history of bleeding disorders. Patient expressed understanding and wished to proceed. All questions were answered. Sterile technique was used throughout the entire procedure. Please see nursing notes for vital signs. Test dose was given through epidural catheter and negative prior to continuing to dose epidural or start infusion. Warning signs of high block given to the patient including shortness of breath, tingling/numbness in hands, complete motor block, or any concerning symptoms with instructions to call for help. Patient  was given instructions on fall risk and not to get out of bed. All questions and concerns addressed with instructions to call with any issues or inadequate analgesia.    Reason for block:procedure for pain

## 2023-05-01 NOTE — Progress Notes (Signed)
Patient ID: Linda Caldwell, female   DOB: [birth year], 27 y.o.   MRN: [id number] Doing well.  Seen at 0200.  Vitals:   05/01/23 0200 05/01/23 0302  BP: 107/88 114/66  Pulse: 87 77  Resp:    Temp:  98.5 F (36.9 C)    Just now getting into tub Waterbirth consent signed an hour ago  FHR has been reactive all night UCs getting more frequent  Dilation: 6 Effacement (%): 80 Cervical Position: Middle Station: -2 Presentation: Vertex Exam by:: [place] Turner, RN  Anticipate SVD/waterbirth

## 2023-05-01 NOTE — Progress Notes (Signed)
Patient ID: Linda Caldwell, female   DOB: [birth year], 27 y.o.   MRN: [id number] Feeling pressure  FHR stable UCs every 2 min  Dilation: 6 Effacement (%): 80 Cervical Position: Middle Station: -2 Presentation: Vertex Exam by:: Wynelle Bourgeois, CNM  Discussed options of continuing labor in water, analgesics or epidural Decided on epidural

## 2023-05-01 NOTE — Progress Notes (Signed)
Multiple phone interpretors via ipad used through out care, difficulty with interpretors relaying information, as well as, dropping call. Husband attempted on multiple occasions to interpret to best of his ability during care.

## 2023-05-01 NOTE — Anesthesia Postprocedure Evaluation (Signed)
Anesthesia Post Note  Patient: Office manager  Procedure(s) Performed: AN AD HOC LABOR EPIDURAL     Patient location during evaluation: Mother Baby Anesthesia Type: Epidural Level of consciousness: awake and alert Pain management: pain level controlled Vital Signs Assessment: post-procedure vital signs reviewed and stable Respiratory status: spontaneous breathing, nonlabored ventilation and respiratory function stable Cardiovascular status: stable Postop Assessment: no headache, no backache and epidural receding Anesthetic complications: no   No notable events documented.  Last Vitals:  Vitals:   05/01/23 0915 05/01/23 1300  BP: 124/69 116/66  Pulse: 77 66  Resp: 16 16  Temp: 36.9 C 37.1 C  SpO2: 100% 100%    Last Pain:  Vitals:   05/01/23 1300  TempSrc: Oral  PainSc: 3    Pain Goal:                   Linda Caldwell

## 2023-05-02 DIAGNOSIS — Z30017 Encounter for initial prescription of implantable subdermal contraceptive: Secondary | ICD-10-CM | POA: Diagnosis not present

## 2023-05-02 MED ORDER — LIDOCAINE HCL 1 % IJ SOLN
0.0000 mL | Freq: Once | INTRAMUSCULAR | Status: AC | PRN
  Administered 2023-05-02: 3 mL via INTRADERMAL
  Filled 2023-05-02: qty 20

## 2023-05-02 MED ORDER — ETONOGESTREL 68 MG ~~LOC~~ IMPL
68.0000 mg | DRUG_IMPLANT | Freq: Once | SUBCUTANEOUS | Status: AC
  Administered 2023-05-02: 68 mg via SUBCUTANEOUS
  Filled 2023-05-02: qty 1

## 2023-05-02 MED ORDER — IBUPROFEN 600 MG PO TABS: 600.0000 mg | ORAL_TABLET | Freq: Four times a day (QID) | ORAL | 0 refills | Status: AC

## 2023-05-02 MED ORDER — ACETAMINOPHEN 500 MG PO TABS: 1000.0000 mg | ORAL_TABLET | Freq: Four times a day (QID) | ORAL | 0 refills | Status: AC | PRN

## 2023-05-02 NOTE — Lactation Note (Signed)
This note was copied from a baby's chart. Lactation Consultation Note  Patient Name: Linda Caldwell [id number] Date: 05/02/2023 Age:25 hours, P 2  Reason for consult: Follow-up assessment;Term;Infant weight loss (1 % weight loss, Carmelina Peal [phone]) LC reviewed supply and demand, importance of giving her baby time to latch at the breast, Since the baby has had so many bottles - prior to latching, breast massage, hand express, pre - pump with the hand pump and latch, Supplement if needed until the milk comes in.  Add 3-4 post pumpings after the baby feeds to enhance the milk coming in. Save the milk to feed back to the baby. Per mom aware of storage of breast milk.  LC reviewed engorgement prevention and tx. Provided mom with a hand pump.    Maternal Data Does the patient have breastfeeding experience prior to this delivery?: Yes How long did the patient breastfeed?: per mom 1 year  Feeding Mother's Current Feeding Choice: Breast Milk and Formula Nipple Type: Slow - flow    Lactation Tools Discussed/Used  Hand pump with #18 F, #21    Interventions Interventions: Breast feeding basics reviewed;Hand pump;Education;LC Services brochure  Discharge Discharge Education: Engorgement and breast care;Warning signs for feeding baby Pump: Personal;Manual;DEBP  Consult Status Consult Status: Complete Date: 05/02/23    Kathrin Greathouse 05/02/2023, 12:13 PM

## 2023-05-02 NOTE — Procedures (Signed)
Immediate Post-Partum Nexplanon Insertion Procedure Note  Patient was identified.Risks and benefits of Nexplanon reviewed.  Informed consent was signed, signed copy in chart. A time-out was performed.    The insertion site was identified 8-10 cm (3-4 inches) from the medial epicondyle of the humerus and 3-5 cm (1.25-2 inches) posterior to (below) the sulcus (groove) between the biceps and triceps muscles of the patient's left arm and marked. The site was prepped and draped in the usual sterile fashion. Pt was prepped with alcohol swab and then injected with 2 cc of 1% lidocaine. The site was prepped with betadine. Nexplanon removed form packaging,  Device confirmed in needle, then inserted full length of needle and withdrawn per handbook instructions. Provider and patient verified presence of the implant in the woman's arm by palpation. Pt insertion site was covered with steristrips/adhesive bandage and pressure bandage. There was minimal blood loss. Patient tolerated procedure well.  Patient was given post procedure instructions and Nexplanon user card with expiration date. Condoms were recommended for STI prevention. Patient was asked to keep the pressure dressing on for 24 hours to minimize bruising and keep the adhesive bandage on for 3-5 days. The patient verbalized understanding of the plan of care and agrees.   Lot # [id number] Expiration Date10/2026

## 2023-05-03 ENCOUNTER — Inpatient Hospital Stay (HOSPITAL_COMMUNITY): Payer: Medicaid Other

## 2023-05-08 ENCOUNTER — Ambulatory Visit (INDEPENDENT_AMBULATORY_CARE_PROVIDER_SITE_OTHER): Payer: Medicaid Other | Admitting: Licensed Clinical Social Worker

## 2023-05-08 DIAGNOSIS — F4321 Adjustment disorder with depressed mood: Secondary | ICD-10-CM

## 2023-05-08 NOTE — BH Specialist Note (Unsigned)
Integrated Behavioral Health Initial In-Person Visit  MRN: [id number] Name: Linda Caldwell  Number of Integrated Behavioral Health Clinician visits: No data recorded Session Start time: No data recorded   1:14 PM  Session End time: No data recorded Total time in minutes: No data recorded  Types of Service: Individual psychotherapy  Interpretor:Yes.   Interpretor Name and Language: Vergie Living [phone]   Warm Hand Off Completed.        Subjective: Linda Caldwell is a 27 y.o. female accompanied by  Husband who sat in the waiting area.  Patient was referred by *** for ***. Patient reports the following symptoms/concerns: *** Duration of problem: Weeks; Severity of problem: moderate  Objective: Mood: Depressed and Affect: Appropriate Risk of harm to self or others: {CHL AMB BH Suicide Current Mental Status:21022748}  Life Context: Family and Social: *** School/Work: *** Self-Care: Takes walks,  Life Changes: Recent birth of daughter.   Patient and/or Family's Strengths/Protective Factors: {CHL AMB BH PROTECTIVE FACTORS:[phone]}  Goals Addressed: Patient will: Reduce symptoms of: anxiety and depression Increase knowledge and/or ability of: coping skills, healthy habits, and self-management skills  Demonstrate ability to: Increase healthy adjustment to current life circumstances and Increase adequate support systems for patient/family  Progress towards Goals: Ongoing  Interventions: Interventions utilized: Mindfulness or Relaxation Training, Supportive Counseling, Psychoeducation and/or Health Education, and Supportive Reflection  Standardized Assessments completed: PHQ-SADS     05/08/2023    1:48 PM 10/19/2022   12:22 PM 10/19/2022   12:21 PM  PHQ-SADS Last 3 Score only  PHQ-15 Score 7    Total GAD-7 Score 5 0   PHQ Adolescent Score 10  2     Patient and/or Family Response: feeling guilty for having epidural birth   Husband is very helpful  with babies. 2 children and she's transitioning well.   Baby girl, 7 pounds 4 oz..  She did her best.   Feels like at the end the epidural did not work.. desired a natural birth so beating herself up for receiving it.   Went over 40 weeks.. Nothing was what she planned or expected.    At night, increased intrusive thoughts about labor and delivery, upset because she could not bear with the pain.Marland Kitchen thinks about how she would get relief from guilt but knows she can't and will never hurt herself. Have spoke with husband about it.   Provided information to [facility] and 988  She does have extra support around her to help her   Will try to journal and take walks..    Patient Centered Plan: Patient is on the following Treatment Plan(s):  depression and anxiety  Assessment: Patient currently experiencing ***.   Patient may benefit from ***.  Plan: Follow up with behavioral health clinician on : *** Behavioral recommendations: *** Referral(s): Integrated [facility] (In Clinic) "From scale of 1-10, how likely are you to follow plan?": Patient agreeable to above plan.   Burnetta Kohls [name], LCSWA

## 2023-05-13 ENCOUNTER — Telehealth (HOSPITAL_COMMUNITY): Payer: Self-pay | Admitting: *Deleted

## 2023-05-13 ENCOUNTER — Institutional Professional Consult (permissible substitution): Payer: Medicaid Other | Admitting: Licensed Clinical Social Worker

## 2023-05-13 NOTE — Telephone Encounter (Signed)
05/13/2023  Name: Linda Caldwell MRN: [id number] DOB: 16-Jul-1995  Reason for Call:  Transition of Care Hospital Discharge Call  Contact Status: Patient Contact Status: Message  Language assistant needed: Interpreter Mode: Telephonic Interpreter Interpreter Name: [name] [phone]        Follow-Up Questions:    Inocente Salles Postnatal Depression Scale:  In the Past 7 Days:    PHQ2-9 Depression Scale:     Discharge Follow-up:    Post-discharge interventions: NA  Salena Saner, RN 05/13/2023 13:55

## 2023-05-21 ENCOUNTER — Ambulatory Visit: Payer: Medicaid Other | Admitting: Licensed Clinical Social Worker

## 2023-05-21 DIAGNOSIS — F4321 Adjustment disorder with depressed mood: Secondary | ICD-10-CM

## 2023-05-21 NOTE — BH Specialist Note (Unsigned)
Integrated Behavioral Health via Telemedicine Visit  05/23/2023 Linda Caldwell [id number]  Number of Integrated Behavioral Health Clinician visits: 2- Second Visit  Session Start time: 1145   Session End time: 1247  Total time in minutes: 62   Referring Provider: Provider Patient/Family location: At [facility] Provider location: Remote Office All persons participating in visit: Patient, Husband and [facility] Types of Service: Family psychotherapy and Video visit  I connected with Linda Caldwell and/or Linda Caldwell's patient via  Telephone or Engineer, civil (consulting)  (Video is Caregility application) and verified that I am speaking with the correct person using two identifiers. Discussed confidentiality: Yes   I discussed the limitations of telemedicine and the availability of in person appointments.  Discussed there is a possibility of technology failure and discussed alternative modes of communication if that failure occurs.  I discussed that engaging in this telemedicine visit, they consent to the provision of behavioral healthcare and the services will be billed under their insurance.  Patient and/or legal guardian expressed understanding and consented to Telemedicine visit: Yes   Presenting Concerns: Patient and/or family reports the following symptoms/concerns: Improvements with mood and symptoms.  Duration of problem: Weeks; Severity of problem: moderate  Patient and/or Family's Strengths/Protective Factors: Social and Emotional competence, Concrete supports in place (healthy food, safe environments, etc.), Caregiver has knowledge of parenting & child development, and Parental Resilience  Goals Addressed: Patient will:  Reduce symptoms of: anxiety and depression   Increase knowledge and/or ability of: coping skills, healthy habits, and self-management skills   Demonstrate ability to: Increase healthy adjustment to current life circumstances and  Increase adequate support systems for patient/family  Progress towards Goals: Discontinued  Interventions: Interventions utilized:  Mindfulness or Management consultant, Supportive Counseling, Psychoeducation and/or Health Education, and Preventative Services/Health Promotion Standardized Assessments completed: PHQ-SADS     05/21/2023   12:24 PM 05/08/2023    1:48 PM 10/19/2022   12:22 PM  PHQ-SADS Last 3 Score only  PHQ-15 Score 5 7   Total GAD-7 Score 1 5 0  PHQ Adolescent Score 4 10      Patient and/or Family Response: Patient reported feeling better compared to the previous session. She is managing her physical pain with ibuprofen and is maintaining a positive outlook on her birthing experience. She expressed satisfaction with the support she received during childbirth and highlighted her ability to advocate for herself effectively when in pain.  Patient acknowledged occasional negative thoughts about her birthing experience but is actively replacing them with positive thoughts. She expressed a need for additional support at home, as she currently lives with husband who works long hours and she is home with children most days alone. Patient is interested in enrolling in a home visiting program to receive support from a caseworker or nurse.  Patient disclosed ongoing suicidal thoughts but emphasized her ability to keep herself and her family safe. She stated her motivation to be a better wife and mother prevents her from acting on these thoughts. Patient clarified that she has never had a plan to harm herself or others, and these thoughts are occasional reflections about life without her presence.  Patient reports understanding of PHQ score and agrees that she is feeling less anxious and depressed. She reports she has started journaling at night and finds it helpful. She expressed interest in returning to school and work in 2025 and was provided with resources for daycare assistance to support  this goal. Patient declined interested in following up with [facility] but  agreed that she would if symptoms worsen.     Assessment: Patient currently experiencing a mix of emotional recovery and ongoing challenges following childbirth, including managing pain, occasional suicidal thoughts and the need for additional support at home. Despite these challenges, she demonstrates resilience by maintaining a positive outlook, advocating herself, and engaging in healthy coping strategies like journaling. .   Patient may benefit from continued support of this clinic to support healthy adjustment and implement healthy habits and positive coping mechanisms. .  Plan: Follow up with behavioral health clinician on : no follow up scheduled.  Behavioral recommendations: Patient to continue journaling and using positive self-talk to manage negative negative thoughts. Continue focusing on her long term and short term goals.  Referral(s): Integrated [employer] (In Clinic)  I discussed the assessment and treatment plan with the patient and/or parent/guardian. They were provided an opportunity to ask questions and all were answered. They agreed with the plan and demonstrated an understanding of the instructions.   They were advised to call back or seek an in-person evaluation if the symptoms worsen or if the condition fails to improve as anticipated.  Linda Caldwell [name], LCSWA

## 2023-06-21 ENCOUNTER — Ambulatory Visit: Payer: Medicaid Other | Admitting: Obstetrics and Gynecology

## 2023-06-24 ENCOUNTER — Ambulatory Visit: Payer: Medicaid Other | Admitting: Advanced Practice Midwife
